# Patient Record
Sex: Female | Born: 1950
Health system: Southern US, Community
[De-identification: ages and names within clinical notes are randomized; demographics above are authoritative.]

## PROBLEM LIST (undated history)

## (undated) DIAGNOSIS — D649 Anemia, unspecified: Secondary | ICD-10-CM

## (undated) DIAGNOSIS — C801 Malignant (primary) neoplasm, unspecified: Secondary | ICD-10-CM

## (undated) DIAGNOSIS — G5601 Carpal tunnel syndrome, right upper limb: Secondary | ICD-10-CM

## (undated) DIAGNOSIS — R011 Cardiac murmur, unspecified: Secondary | ICD-10-CM

## (undated) HISTORY — PX: CARPAL TUNNEL RELEASE: SHX101

## (undated) HISTORY — DX: Anemia, unspecified: D64.9

## (undated) HISTORY — DX: Malignant (primary) neoplasm, unspecified: C80.1

## (undated) HISTORY — PX: NOSE SURGERY: SHX723

## (undated) HISTORY — PX: APPENDECTOMY: SHX54

## (undated) HISTORY — DX: Cardiac murmur, unspecified: R01.1

## (undated) HISTORY — DX: Carpal tunnel syndrome, right upper limb: G56.01

## (undated) HISTORY — PX: BREAST SURGERY: SHX581

---

## 1998-07-04 ENCOUNTER — Other Ambulatory Visit: Admission: RE | Admit: 1998-07-04 | Discharge: 1998-07-04 | Payer: Self-pay | Admitting: Gynecology

## 1999-01-23 ENCOUNTER — Emergency Department (HOSPITAL_COMMUNITY): Admission: EM | Admit: 1999-01-23 | Discharge: 1999-01-23 | Payer: Self-pay | Admitting: Emergency Medicine

## 1999-07-07 ENCOUNTER — Other Ambulatory Visit: Admission: RE | Admit: 1999-07-07 | Discharge: 1999-07-07 | Payer: Self-pay | Admitting: Gynecology

## 2001-12-20 ENCOUNTER — Other Ambulatory Visit: Admission: RE | Admit: 2001-12-20 | Discharge: 2001-12-20 | Payer: Self-pay | Admitting: Gynecology

## 2002-08-28 ENCOUNTER — Emergency Department (HOSPITAL_COMMUNITY): Admission: EM | Admit: 2002-08-28 | Discharge: 2002-08-28 | Payer: Self-pay | Admitting: Emergency Medicine

## 2002-08-28 ENCOUNTER — Encounter: Payer: Self-pay | Admitting: Emergency Medicine

## 2002-12-08 ENCOUNTER — Other Ambulatory Visit: Admission: RE | Admit: 2002-12-08 | Discharge: 2002-12-08 | Payer: Self-pay | Admitting: Family Medicine

## 2003-02-27 ENCOUNTER — Encounter: Admission: RE | Admit: 2003-02-27 | Discharge: 2003-02-27 | Payer: Self-pay | Admitting: Family Medicine

## 2003-02-27 ENCOUNTER — Encounter: Payer: Self-pay | Admitting: Family Medicine

## 2005-04-21 ENCOUNTER — Other Ambulatory Visit: Admission: RE | Admit: 2005-04-21 | Discharge: 2005-04-21 | Payer: Self-pay | Admitting: Gynecology

## 2005-05-20 ENCOUNTER — Encounter: Admission: RE | Admit: 2005-05-20 | Discharge: 2005-05-20 | Payer: Self-pay | Admitting: Surgery

## 2005-05-20 ENCOUNTER — Encounter (INDEPENDENT_AMBULATORY_CARE_PROVIDER_SITE_OTHER): Payer: Self-pay | Admitting: Diagnostic Radiology

## 2005-05-20 ENCOUNTER — Encounter (INDEPENDENT_AMBULATORY_CARE_PROVIDER_SITE_OTHER): Payer: Self-pay | Admitting: Specialist

## 2005-05-20 HISTORY — PX: BREAST BIOPSY: SHX20

## 2005-05-28 ENCOUNTER — Encounter: Admission: RE | Admit: 2005-05-28 | Discharge: 2005-05-28 | Payer: Self-pay | Admitting: Surgery

## 2005-06-03 ENCOUNTER — Encounter: Admission: RE | Admit: 2005-06-03 | Discharge: 2005-06-03 | Payer: Self-pay | Admitting: Surgery

## 2005-06-04 ENCOUNTER — Ambulatory Visit (HOSPITAL_COMMUNITY): Admission: RE | Admit: 2005-06-04 | Discharge: 2005-06-04 | Payer: Self-pay | Admitting: Surgery

## 2005-06-04 ENCOUNTER — Encounter (INDEPENDENT_AMBULATORY_CARE_PROVIDER_SITE_OTHER): Payer: Self-pay | Admitting: Surgery

## 2005-06-04 ENCOUNTER — Ambulatory Visit (HOSPITAL_BASED_OUTPATIENT_CLINIC_OR_DEPARTMENT_OTHER): Admission: RE | Admit: 2005-06-04 | Discharge: 2005-06-05 | Payer: Self-pay | Admitting: Surgery

## 2005-06-04 ENCOUNTER — Encounter (INDEPENDENT_AMBULATORY_CARE_PROVIDER_SITE_OTHER): Payer: Self-pay | Admitting: *Deleted

## 2005-06-04 HISTORY — PX: MASTECTOMY: SHX3

## 2005-06-08 ENCOUNTER — Ambulatory Visit: Payer: Self-pay | Admitting: Oncology

## 2005-06-25 ENCOUNTER — Ambulatory Visit (HOSPITAL_COMMUNITY): Admission: RE | Admit: 2005-06-25 | Discharge: 2005-06-25 | Payer: Self-pay | Admitting: Unknown Physician Specialty

## 2005-10-14 ENCOUNTER — Ambulatory Visit: Payer: Self-pay | Admitting: Oncology

## 2006-02-04 ENCOUNTER — Ambulatory Visit: Payer: Self-pay | Admitting: Oncology

## 2006-02-10 LAB — CBC WITH DIFFERENTIAL/PLATELET
BASO%: 0.4 % (ref 0.0–2.0)
Basophils Absolute: 0 10*3/uL (ref 0.0–0.1)
EOS%: 1.5 % (ref 0.0–7.0)
HGB: 10.6 g/dL — ABNORMAL LOW (ref 11.6–15.9)
MCH: 30 pg (ref 26.0–34.0)
MCHC: 33.5 g/dL (ref 32.0–36.0)
MONO#: 0.5 10*3/uL (ref 0.1–0.9)
RDW: 11.8 % (ref 11.3–14.5)
WBC: 6.1 10*3/uL (ref 3.9–10.0)
lymph#: 2.1 10*3/uL (ref 0.9–3.3)

## 2006-02-10 LAB — MORPHOLOGY: PLT EST: ADEQUATE

## 2006-02-10 LAB — IRON AND TIBC
%SAT: 14 % — ABNORMAL LOW (ref 20–55)
TIBC: 336 ug/dL (ref 250–470)
UIBC: 289 ug/dL

## 2006-02-10 LAB — COMPREHENSIVE METABOLIC PANEL
ALT: 12 U/L (ref 0–40)
AST: 14 U/L (ref 0–37)
Creatinine, Ser: 0.81 mg/dL (ref 0.40–1.20)
Total Bilirubin: 0.3 mg/dL (ref 0.3–1.2)

## 2006-02-10 LAB — FERRITIN: Ferritin: 52 ng/mL (ref 10–291)

## 2006-02-10 LAB — LACTATE DEHYDROGENASE: LDH: 168 U/L (ref 94–250)

## 2006-04-05 ENCOUNTER — Ambulatory Visit: Payer: Self-pay | Admitting: Family Medicine

## 2006-04-07 ENCOUNTER — Encounter: Admission: RE | Admit: 2006-04-07 | Discharge: 2006-04-07 | Payer: Self-pay | Admitting: Family Medicine

## 2006-04-23 ENCOUNTER — Encounter: Admission: RE | Admit: 2006-04-23 | Discharge: 2006-04-23 | Payer: Self-pay | Admitting: Oncology

## 2006-05-04 ENCOUNTER — Ambulatory Visit: Payer: Self-pay | Admitting: Family Medicine

## 2006-05-04 LAB — CONVERTED CEMR LAB
ALT: 17 units/L (ref 0–40)
AST: 23 units/L (ref 0–37)
Albumin: 3.5 g/dL (ref 3.5–5.2)
Alkaline Phosphatase: 34 units/L — ABNORMAL LOW (ref 39–117)
BUN: 10 mg/dL (ref 6–23)
Basophils Absolute: 0 10*3/uL (ref 0.0–0.1)
Basophils Relative: 0.3 % (ref 0.0–1.0)
CO2: 28 meq/L (ref 19–32)
Calcium: 9.4 mg/dL (ref 8.4–10.5)
Chloride: 107 meq/L (ref 96–112)
Chol/HDL Ratio, serum: 2.3
Cholesterol: 161 mg/dL (ref 0–200)
Creatinine, Ser: 0.9 mg/dL (ref 0.4–1.2)
Eosinophil percent: 0.9 % (ref 0.0–5.0)
GFR calc non Af Amer: 70 mL/min
Glomerular Filtration Rate, Af Am: 84 mL/min/{1.73_m2}
Glucose, Bld: 79 mg/dL (ref 70–99)
HCT: 34.3 % — ABNORMAL LOW (ref 36.0–46.0)
HDL: 70.8 mg/dL (ref >39.0)
Hemoglobin: 11.3 g/dL — ABNORMAL LOW (ref 12.0–15.0)
LDL Cholesterol: 79 mg/dL (ref 0–99)
Lymphocytes Relative: 33.6 % (ref 12.0–46.0)
MCHC: 33 g/dL (ref 30.0–36.0)
MCV: 89.8 fL (ref 78.0–100.0)
Monocytes Absolute: 0.3 10*3/uL (ref 0.2–0.7)
Monocytes Relative: 6.4 % (ref 3.0–11.0)
Neutro Abs: 2.8 10*3/uL (ref 1.4–7.7)
Neutrophils Relative %: 58.8 % (ref 43.0–77.0)
Platelets: 205 10*3/uL (ref 150–400)
Potassium: 3.9 meq/L (ref 3.5–5.1)
RBC: 3.82 M/uL — ABNORMAL LOW (ref 3.87–5.11)
RDW: 11.5 % (ref 11.5–14.6)
Sodium: 142 meq/L (ref 135–145)
TSH: 1 microintl units/mL (ref 0.35–5.50)
Total Bilirubin: 0.5 mg/dL (ref 0.3–1.2)
Total Protein: 6.8 g/dL (ref 6.0–8.3)
Triglyceride fasting, serum: 55 mg/dL (ref 0–149)
VLDL: 11 mg/dL (ref 0–40)
WBC: 4.6 10*3/uL (ref 4.5–10.5)

## 2006-05-11 ENCOUNTER — Ambulatory Visit: Payer: Self-pay | Admitting: Family Medicine

## 2006-05-11 ENCOUNTER — Other Ambulatory Visit: Admission: RE | Admit: 2006-05-11 | Discharge: 2006-05-11 | Payer: Self-pay | Admitting: Family Medicine

## 2006-05-11 ENCOUNTER — Encounter: Payer: Self-pay | Admitting: Family Medicine

## 2006-05-11 ENCOUNTER — Encounter: Admission: RE | Admit: 2006-05-11 | Discharge: 2006-05-11 | Payer: Self-pay | Admitting: Oncology

## 2006-06-07 ENCOUNTER — Ambulatory Visit: Payer: Self-pay | Admitting: Oncology

## 2006-06-09 LAB — IRON AND TIBC
%SAT: 16 % — ABNORMAL LOW (ref 20–55)
TIBC: 328 ug/dL (ref 250–470)

## 2006-06-09 LAB — COMPREHENSIVE METABOLIC PANEL
ALT: 12 U/L (ref 0–35)
AST: 15 U/L (ref 0–37)
BUN: 11 mg/dL (ref 6–23)
CO2: 26 mEq/L (ref 19–32)
Creatinine, Ser: 0.76 mg/dL (ref 0.40–1.20)
Glucose, Bld: 101 mg/dL — ABNORMAL HIGH (ref 70–99)
Potassium: 3.7 mEq/L (ref 3.5–5.3)
Total Bilirubin: 0.3 mg/dL (ref 0.3–1.2)

## 2006-06-09 LAB — CBC WITH DIFFERENTIAL/PLATELET
Basophils Absolute: 0 10*3/uL (ref 0.0–0.1)
Eosinophils Absolute: 0 10*3/uL (ref 0.0–0.5)
HGB: 10.6 g/dL — ABNORMAL LOW (ref 11.6–15.9)
MCV: 89.2 fL (ref 81.0–101.0)
MONO#: 0.6 10*3/uL (ref 0.1–0.9)
MONO%: 8.1 % (ref 0.0–13.0)
NEUT#: 4.2 10*3/uL (ref 1.5–6.5)
Platelets: 225 10*3/uL (ref 145–400)
RDW: 12.3 % (ref 11.3–14.5)
WBC: 7.1 10*3/uL (ref 3.9–10.0)

## 2006-06-09 LAB — FERRITIN: Ferritin: 55 ng/mL (ref 10–291)

## 2006-07-15 ENCOUNTER — Ambulatory Visit: Payer: Self-pay | Admitting: Oncology

## 2006-10-29 ENCOUNTER — Ambulatory Visit: Payer: Self-pay | Admitting: Internal Medicine

## 2007-01-26 ENCOUNTER — Ambulatory Visit: Payer: Self-pay | Admitting: Oncology

## 2007-01-31 LAB — CBC WITH DIFFERENTIAL/PLATELET
EOS%: 0.5 % (ref 0.0–7.0)
MCH: 29.5 pg (ref 26.0–34.0)
MCV: 88.4 fL (ref 81.0–101.0)
MONO%: 6.5 % (ref 0.0–13.0)
NEUT#: 4 10*3/uL (ref 1.5–6.5)
RBC: 3.64 10*6/uL — ABNORMAL LOW (ref 3.70–5.32)
RDW: 12.6 % (ref 11.3–14.5)
lymph#: 2.6 10*3/uL (ref 0.9–3.3)

## 2007-01-31 LAB — COMPREHENSIVE METABOLIC PANEL
ALT: 13 U/L (ref 0–35)
AST: 17 U/L (ref 0–37)
Albumin: 4.3 g/dL (ref 3.5–5.2)
Alkaline Phosphatase: 36 U/L — ABNORMAL LOW (ref 39–117)
Potassium: 4 mEq/L (ref 3.5–5.3)
Sodium: 140 mEq/L (ref 135–145)
Total Protein: 7.2 g/dL (ref 6.0–8.3)

## 2007-02-02 LAB — HEMOGLOBINOPATHY EVALUATION
Hgb F Quant: 0 % (ref 0.0–2.0)
Hgb S Quant: 0 % (ref 0.0–0.0)

## 2007-04-26 IMAGING — CR DG CHEST 2V
2 series · 2 of 2 positions shown · non-contrast
Comparison: none

CLINICAL DATA: Pre-op for breast surgery for carcinoma. 
 CHEST X-RAY: 
 The heart size and mediastinal contours are unremarkable.  The lungs are clear.  The visualized skeleton is unremarkable.

[w chest pa]
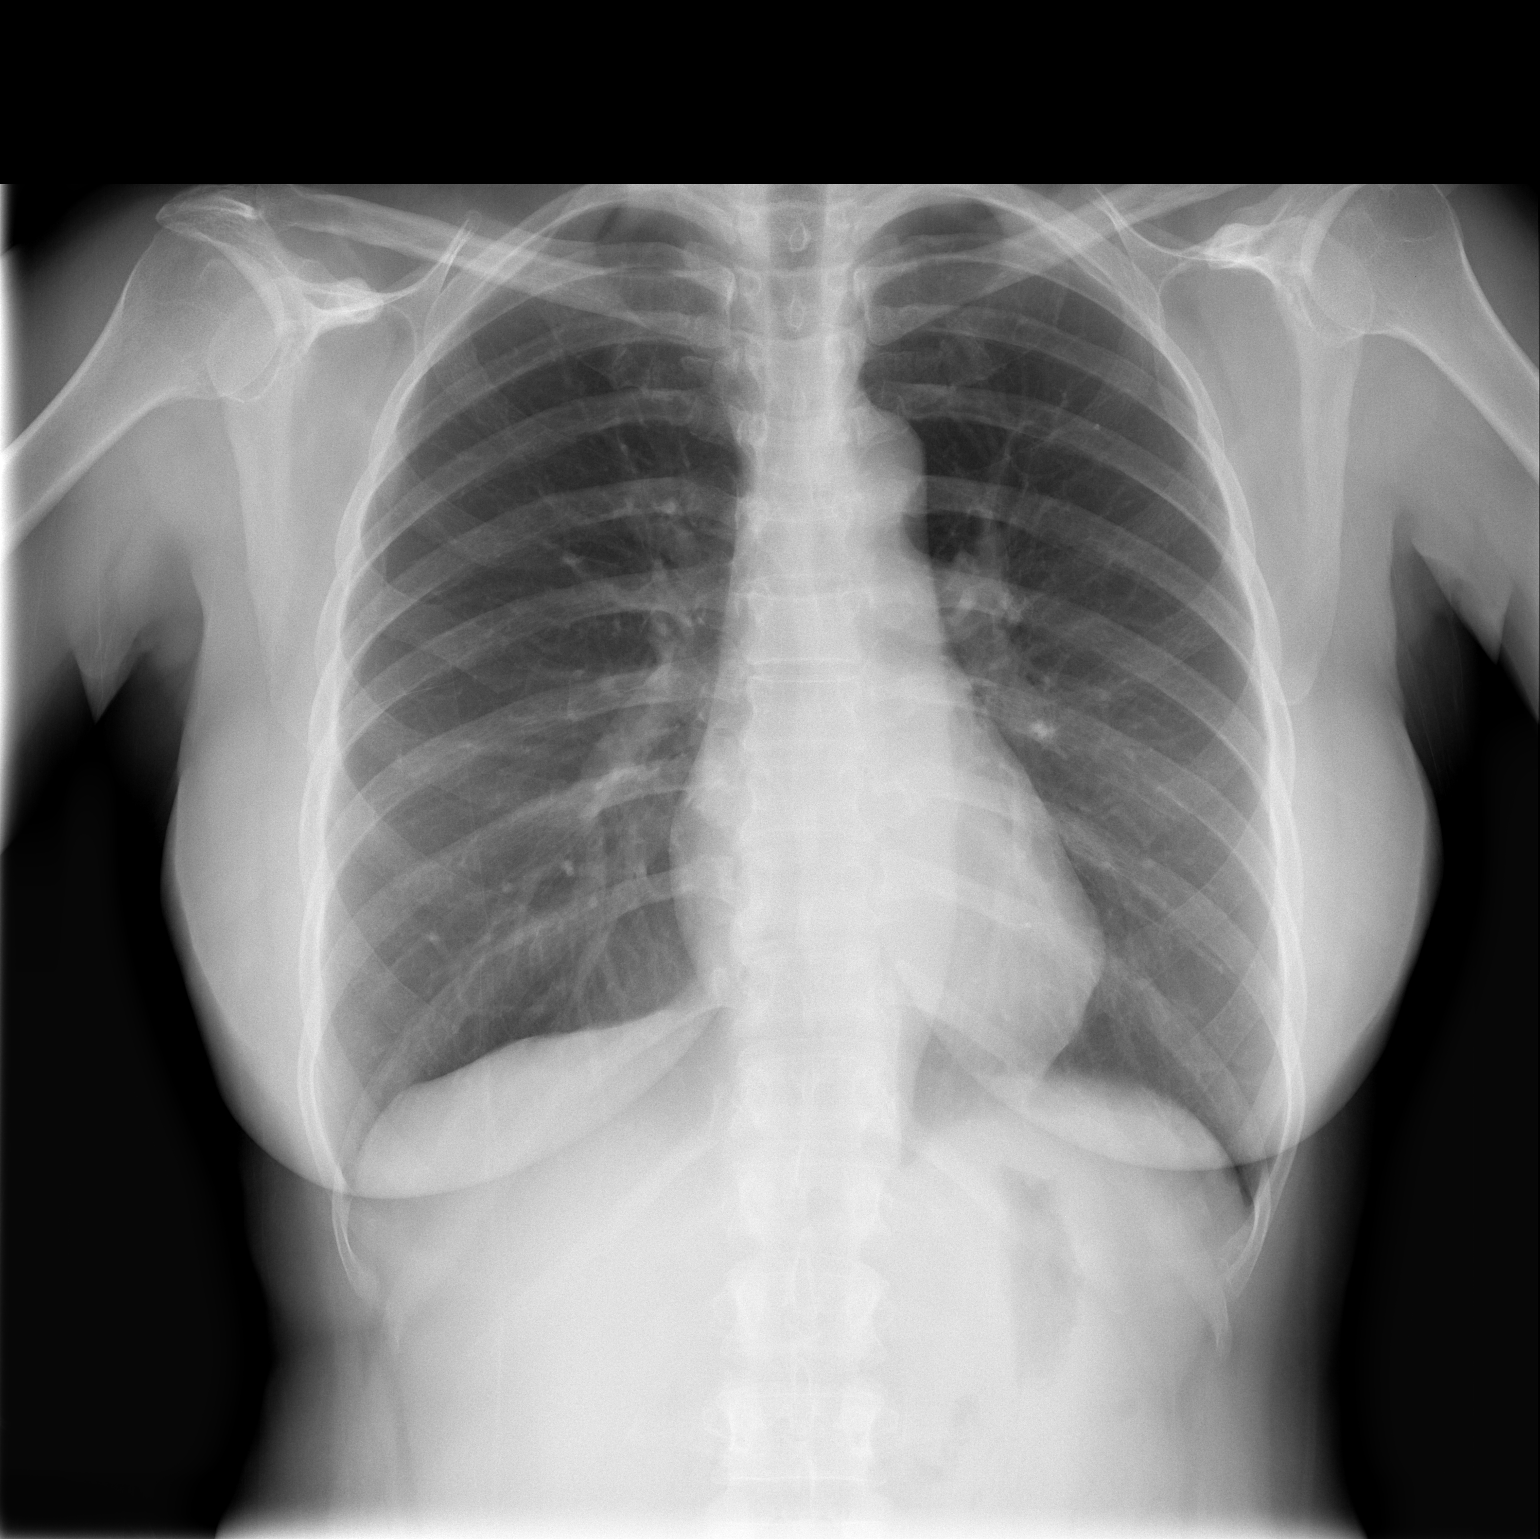

[w chest lat]
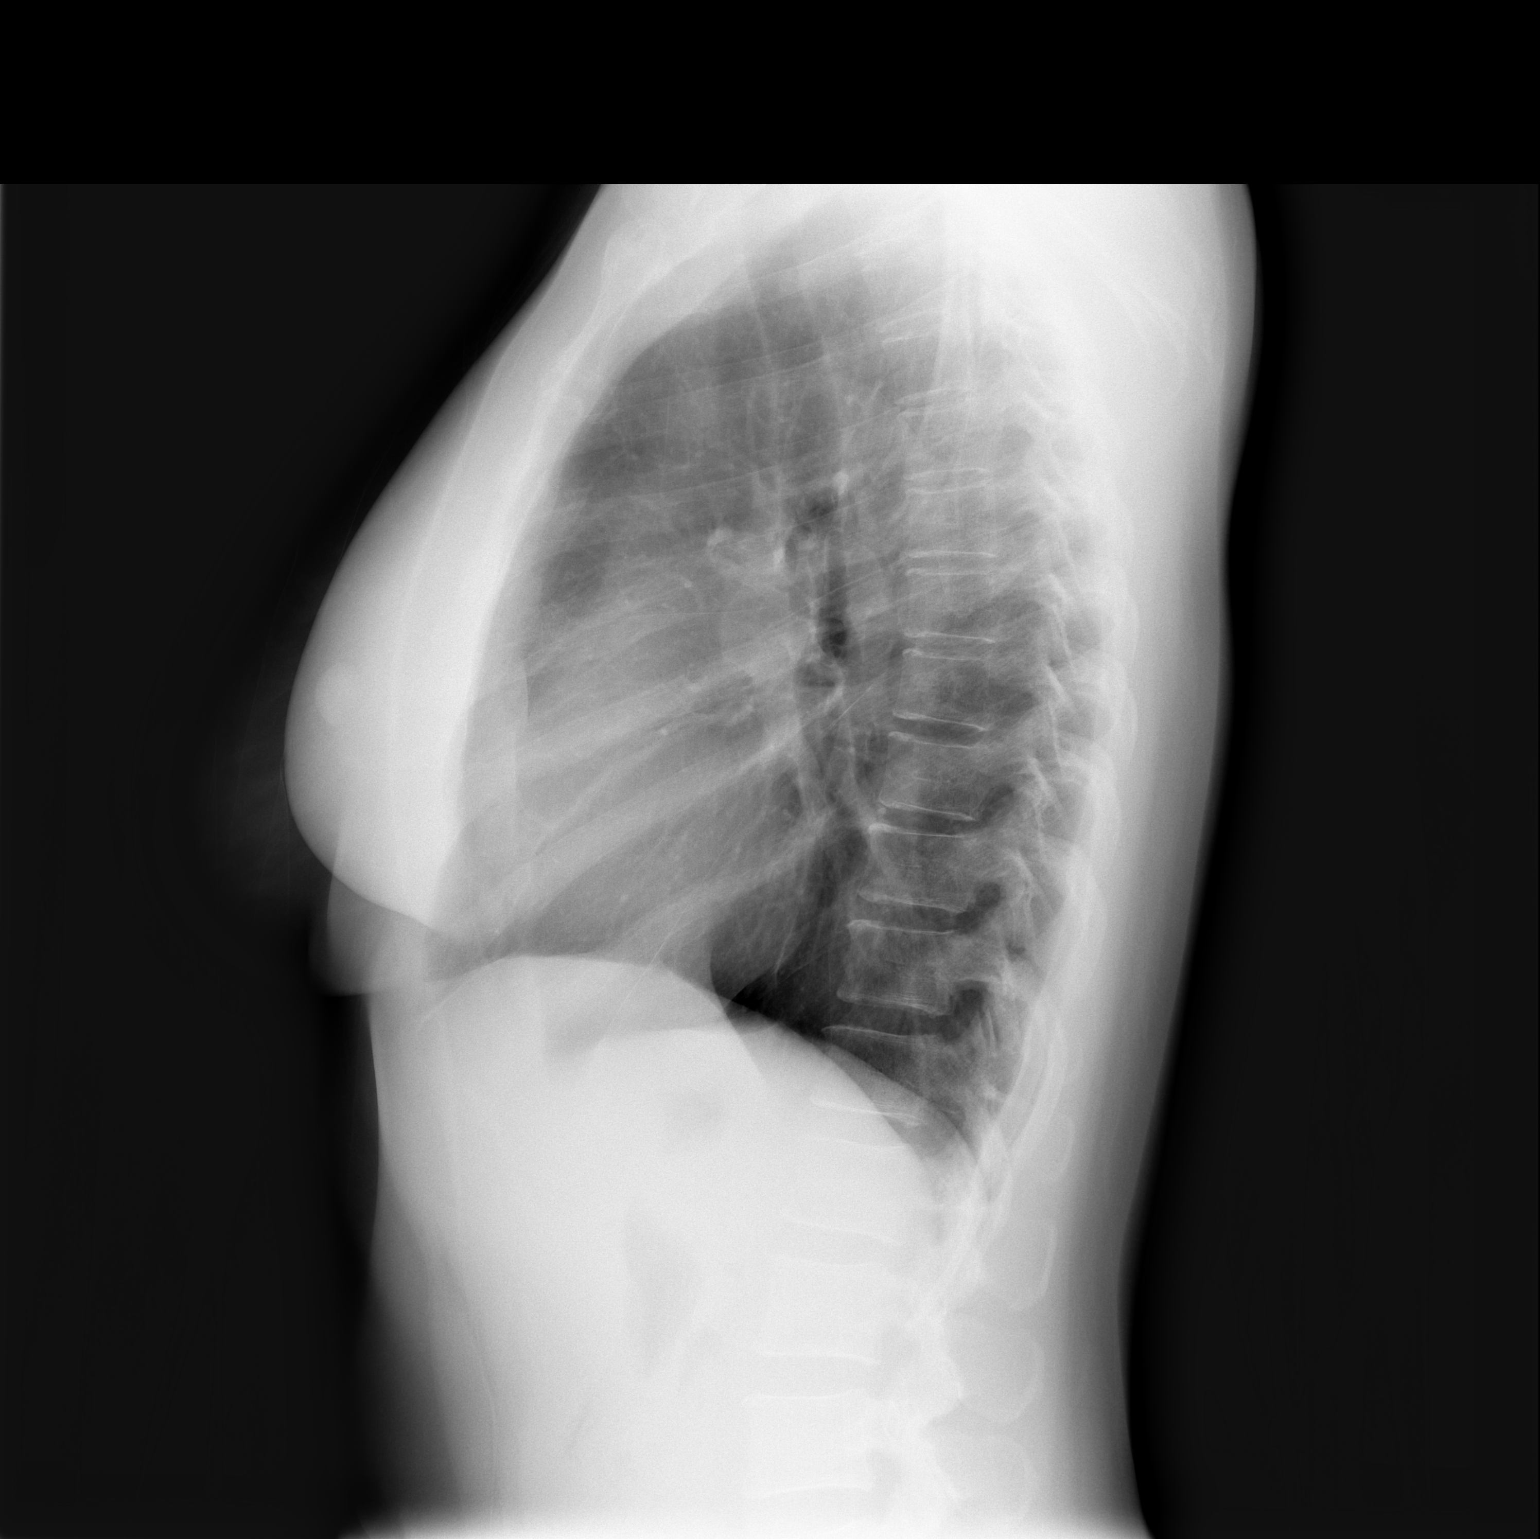

[2 of 2 positions shown; findings below may reference images not displayed]

IMPRESSION: No active lung disease.

## 2007-05-04 ENCOUNTER — Encounter: Admission: RE | Admit: 2007-05-04 | Discharge: 2007-05-04 | Payer: Self-pay | Admitting: Oncology

## 2007-09-01 ENCOUNTER — Ambulatory Visit: Payer: Self-pay | Admitting: Oncology

## 2007-09-05 LAB — CBC & DIFF AND RETIC
Basophils Absolute: 0 10*3/uL (ref 0.0–0.1)
Eosinophils Absolute: 0 10*3/uL (ref 0.0–0.5)
HCT: 32.6 % — ABNORMAL LOW (ref 34.8–46.6)
HGB: 10.9 g/dL — ABNORMAL LOW (ref 11.6–15.9)
IRF: 0.21 (ref 0.130–0.330)
MCH: 29.5 pg (ref 26.0–34.0)
MCV: 87.6 fL (ref 81.0–101.0)
NEUT#: 3.5 10*3/uL (ref 1.5–6.5)
NEUT%: 58.3 % (ref 39.6–76.8)
RDW: 12.6 % (ref 11.3–14.5)
RETIC #: 11.5 10*3/uL — ABNORMAL LOW (ref 19.7–115.1)
lymph#: 1.9 10*3/uL (ref 0.9–3.3)

## 2007-09-05 LAB — MORPHOLOGY: PLT EST: ADEQUATE

## 2007-09-06 LAB — COMPREHENSIVE METABOLIC PANEL
Albumin: 4.2 g/dL (ref 3.5–5.2)
BUN: 12 mg/dL (ref 6–23)
Calcium: 9.3 mg/dL (ref 8.4–10.5)
Chloride: 104 mEq/L (ref 96–112)
Glucose, Bld: 62 mg/dL — ABNORMAL LOW (ref 70–99)
Potassium: 4 mEq/L (ref 3.5–5.3)

## 2007-09-06 LAB — VITAMIN B12: Vitamin B-12: 457 pg/mL (ref 211–911)

## 2007-09-06 LAB — IRON AND TIBC
Iron: 123 ug/dL (ref 42–145)
UIBC: 226 ug/dL

## 2007-09-16 ENCOUNTER — Ambulatory Visit: Payer: Self-pay | Admitting: Family Medicine

## 2007-09-16 LAB — CONVERTED CEMR LAB
ALT: 16 units/L (ref 0–35)
AST: 21 units/L (ref 0–37)
Albumin: 3.6 g/dL (ref 3.5–5.2)
Alkaline Phosphatase: 35 units/L — ABNORMAL LOW (ref 39–117)
BUN: 8 mg/dL (ref 6–23)
Basophils Absolute: 0 10*3/uL (ref 0.0–0.1)
Basophils Relative: 0.3 % (ref 0.0–1.0)
Bilirubin Urine: NEGATIVE
Bilirubin, Direct: 0.2 mg/dL (ref 0.0–0.3)
Blood in Urine, dipstick: NEGATIVE
CO2: 30 meq/L (ref 19–32)
Calcium: 9.4 mg/dL (ref 8.4–10.5)
Chloride: 108 meq/L (ref 96–112)
Cholesterol: 147 mg/dL (ref 0–200)
Creatinine, Ser: 0.7 mg/dL (ref 0.4–1.2)
Eosinophils Absolute: 0 10*3/uL (ref 0.0–0.6)
Eosinophils Relative: 0.7 % (ref 0.0–5.0)
GFR calc Af Amer: 112 mL/min
GFR calc non Af Amer: 92 mL/min
Glucose, Bld: 78 mg/dL (ref 70–99)
Glucose, Urine, Semiquant: NEGATIVE
HCT: 33.8 % — ABNORMAL LOW (ref 36.0–46.0)
HDL: 67.6 mg/dL (ref >39.0)
Hemoglobin: 11 g/dL — ABNORMAL LOW (ref 12.0–15.0)
Ketones, urine, test strip: NEGATIVE
LDL Cholesterol: 71 mg/dL (ref 0–99)
Lymphocytes Relative: 28.6 % (ref 12.0–46.0)
MCHC: 32.6 g/dL (ref 30.0–36.0)
MCV: 89.8 fL (ref 78.0–100.0)
Monocytes Absolute: 0.3 10*3/uL (ref 0.2–0.7)
Monocytes Relative: 7.1 % (ref 3.0–11.0)
Neutro Abs: 3.1 10*3/uL (ref 1.4–7.7)
Neutrophils Relative %: 63.3 % (ref 43.0–77.0)
Nitrite: NEGATIVE
Platelets: 201 10*3/uL (ref 150–400)
Potassium: 4.1 meq/L (ref 3.5–5.1)
Protein, U semiquant: NEGATIVE
RBC: 3.77 M/uL — ABNORMAL LOW (ref 3.87–5.11)
RDW: 11.4 % — ABNORMAL LOW (ref 11.5–14.6)
Sodium: 142 meq/L (ref 135–145)
Specific Gravity, Urine: 1.025
TSH: 1.73 microintl units/mL (ref 0.35–5.50)
Total Bilirubin: 0.6 mg/dL (ref 0.3–1.2)
Total CHOL/HDL Ratio: 2.2
Total Protein: 6.4 g/dL (ref 6.0–8.3)
Triglycerides: 44 mg/dL (ref 0–149)
Urobilinogen, UA: 0.2
VLDL: 9 mg/dL (ref 0–40)
WBC Urine, dipstick: NEGATIVE
WBC: 4.8 10*3/uL (ref 4.5–10.5)
pH: 5.5

## 2007-09-23 ENCOUNTER — Ambulatory Visit: Payer: Self-pay | Admitting: Family Medicine

## 2007-09-23 ENCOUNTER — Encounter: Payer: Self-pay | Admitting: Family Medicine

## 2007-09-23 ENCOUNTER — Other Ambulatory Visit: Admission: RE | Admit: 2007-09-23 | Discharge: 2007-09-23 | Payer: Self-pay | Admitting: Family Medicine

## 2007-09-24 DIAGNOSIS — Z9010 Acquired absence of unspecified breast and nipple: Secondary | ICD-10-CM

## 2007-10-21 ENCOUNTER — Ambulatory Visit: Payer: Self-pay | Admitting: Gastroenterology

## 2007-10-31 ENCOUNTER — Encounter: Payer: Self-pay | Admitting: Family Medicine

## 2007-10-31 ENCOUNTER — Encounter: Payer: Self-pay | Admitting: Gastroenterology

## 2007-10-31 ENCOUNTER — Ambulatory Visit: Payer: Self-pay | Admitting: Gastroenterology

## 2008-02-28 ENCOUNTER — Ambulatory Visit: Payer: Self-pay | Admitting: Oncology

## 2008-03-02 LAB — CBC & DIFF AND RETIC
BASO%: 0.6 % (ref 0.0–2.0)
EOS%: 0.7 % (ref 0.0–7.0)
LYMPH%: 29.2 % (ref 14.0–48.0)
MCH: 30 pg (ref 26.0–34.0)
MCHC: 33.4 g/dL (ref 32.0–36.0)
MCV: 89.8 fL (ref 81.0–101.0)
MONO%: 5.8 % (ref 0.0–13.0)
NEUT#: 3.6 10*3/uL (ref 1.5–6.5)
Platelets: 180 10*3/uL (ref 145–400)
RBC: 3.68 10*6/uL — ABNORMAL LOW (ref 3.70–5.32)
RDW: 12.4 % (ref 11.3–14.5)
RETIC #: 26.1 10*3/uL (ref 19.7–115.1)
Retic %: 0.7 % (ref 0.4–2.3)

## 2008-03-05 LAB — COMPREHENSIVE METABOLIC PANEL
AST: 18 U/L (ref 0–37)
Albumin: 4.3 g/dL (ref 3.5–5.2)
Alkaline Phosphatase: 43 U/L (ref 39–117)
BUN: 13 mg/dL (ref 6–23)
Creatinine, Ser: 0.66 mg/dL (ref 0.40–1.20)
Glucose, Bld: 82 mg/dL (ref 70–99)

## 2008-03-05 LAB — IRON AND TIBC
%SAT: 15 % — ABNORMAL LOW (ref 20–55)
Iron: 53 ug/dL (ref 42–145)
UIBC: 292 ug/dL

## 2008-03-05 LAB — FERRITIN: Ferritin: 67 ng/mL (ref 10–291)

## 2008-03-05 LAB — CANCER ANTIGEN 27.29: CA 27.29: 19 U/mL (ref 0–39)

## 2008-03-11 LAB — ESTRADIOL, ULTRA SENS

## 2008-04-05 ENCOUNTER — Encounter: Admission: RE | Admit: 2008-04-05 | Discharge: 2008-04-05 | Payer: Self-pay | Admitting: Oncology

## 2008-05-04 ENCOUNTER — Encounter: Admission: RE | Admit: 2008-05-04 | Discharge: 2008-05-04 | Payer: Self-pay | Admitting: Family Medicine

## 2008-09-18 ENCOUNTER — Ambulatory Visit: Payer: Self-pay | Admitting: Family Medicine

## 2008-09-18 LAB — CONVERTED CEMR LAB
ALT: 16 units/L (ref 0–35)
AST: 20 units/L (ref 0–37)
Albumin: 3.7 g/dL (ref 3.5–5.2)
Alkaline Phosphatase: 37 units/L — ABNORMAL LOW (ref 39–117)
BUN: 10 mg/dL (ref 6–23)
Basophils Absolute: 0 10*3/uL (ref 0.0–0.1)
Basophils Relative: 0.4 % (ref 0.0–3.0)
Bilirubin Urine: NEGATIVE
Bilirubin, Direct: 0.1 mg/dL (ref 0.0–0.3)
Blood in Urine, dipstick: NEGATIVE
CO2: 29 meq/L (ref 19–32)
Calcium: 9.5 mg/dL (ref 8.4–10.5)
Chloride: 104 meq/L (ref 96–112)
Cholesterol: 177 mg/dL (ref 0–200)
Creatinine, Ser: 0.6 mg/dL (ref 0.4–1.2)
Eosinophils Absolute: 0.1 10*3/uL (ref 0.0–0.7)
Eosinophils Relative: 1.2 % (ref 0.0–5.0)
GFR calc Af Amer: 133 mL/min
GFR calc non Af Amer: 110 mL/min
Glucose, Bld: 75 mg/dL (ref 70–99)
Glucose, Urine, Semiquant: NEGATIVE
HCT: 32.6 % — ABNORMAL LOW (ref 36.0–46.0)
HDL: 85.4 mg/dL (ref >39.0)
Hemoglobin: 10.8 g/dL — ABNORMAL LOW (ref 12.0–15.0)
Ketones, urine, test strip: NEGATIVE
LDL Cholesterol: 81 mg/dL (ref 0–99)
Lymphocytes Relative: 30.3 % (ref 12.0–46.0)
MCHC: 33.1 g/dL (ref 30.0–36.0)
MCV: 89.6 fL (ref 78.0–100.0)
Monocytes Absolute: 0.3 10*3/uL (ref 0.1–1.0)
Monocytes Relative: 6.4 % (ref 3.0–12.0)
Neutro Abs: 2.9 10*3/uL (ref 1.4–7.7)
Neutrophils Relative %: 61.7 % (ref 43.0–77.0)
Nitrite: NEGATIVE
Platelets: 160 10*3/uL (ref 150–400)
Potassium: 3.7 meq/L (ref 3.5–5.1)
Protein, U semiquant: NEGATIVE
RBC: 3.64 M/uL — ABNORMAL LOW (ref 3.87–5.11)
RDW: 11.9 % (ref 11.5–14.6)
Sodium: 142 meq/L (ref 135–145)
Specific Gravity, Urine: 1.02
TSH: 1.25 microintl units/mL (ref 0.35–5.50)
Total Bilirubin: 0.9 mg/dL (ref 0.3–1.2)
Total CHOL/HDL Ratio: 2.1
Total Protein: 6.7 g/dL (ref 6.0–8.3)
Triglycerides: 53 mg/dL (ref 0–149)
Urobilinogen, UA: 0.2
VLDL: 11 mg/dL (ref 0–40)
WBC Urine, dipstick: NEGATIVE
WBC: 4.8 10*3/uL (ref 4.5–10.5)
pH: 5

## 2008-09-24 ENCOUNTER — Other Ambulatory Visit: Admission: RE | Admit: 2008-09-24 | Discharge: 2008-09-24 | Payer: Self-pay | Admitting: Family Medicine

## 2008-09-24 ENCOUNTER — Encounter: Payer: Self-pay | Admitting: Family Medicine

## 2008-09-24 ENCOUNTER — Ambulatory Visit: Payer: Self-pay | Admitting: Family Medicine

## 2008-09-24 DIAGNOSIS — D649 Anemia, unspecified: Secondary | ICD-10-CM | POA: Insufficient documentation

## 2008-09-26 ENCOUNTER — Telehealth: Payer: Self-pay | Admitting: *Deleted

## 2008-10-05 ENCOUNTER — Ambulatory Visit: Payer: Self-pay | Admitting: Oncology

## 2008-10-09 LAB — LACTATE DEHYDROGENASE: LDH: 146 U/L (ref 94–250)

## 2008-10-09 LAB — COMPREHENSIVE METABOLIC PANEL
ALT: 19 U/L (ref 0–35)
AST: 22 U/L (ref 0–37)
Albumin: 3.7 g/dL (ref 3.5–5.2)
Calcium: 9 mg/dL (ref 8.4–10.5)
Chloride: 105 mEq/L (ref 96–112)
Potassium: 3.8 mEq/L (ref 3.5–5.3)
Total Protein: 6.7 g/dL (ref 6.0–8.3)

## 2008-10-09 LAB — CBC WITH DIFFERENTIAL/PLATELET
BASO%: 0.3 % (ref 0.0–2.0)
EOS%: 1.1 % (ref 0.0–7.0)
HGB: 10.6 g/dL — ABNORMAL LOW (ref 11.6–15.9)
MCH: 29.5 pg (ref 25.1–34.0)
MCHC: 33 g/dL (ref 31.5–36.0)
RDW: 12.7 % (ref 11.2–14.5)
WBC: 8.5 10*3/uL (ref 3.9–10.3)
lymph#: 2.2 10*3/uL (ref 0.9–3.3)

## 2008-10-10 LAB — IRON AND TIBC
%SAT: 10 % — ABNORMAL LOW (ref 20–55)
Iron: 33 ug/dL — ABNORMAL LOW (ref 42–145)

## 2008-10-10 LAB — VITAMIN D 25 HYDROXY (VIT D DEFICIENCY, FRACTURES): Vit D, 25-Hydroxy: 21 ng/mL — ABNORMAL LOW (ref 30–89)

## 2008-10-19 ENCOUNTER — Ambulatory Visit: Payer: Self-pay | Admitting: Family Medicine

## 2008-10-19 ENCOUNTER — Encounter: Payer: Self-pay | Admitting: Family Medicine

## 2008-10-23 DIAGNOSIS — B079 Viral wart, unspecified: Secondary | ICD-10-CM | POA: Insufficient documentation

## 2008-12-19 ENCOUNTER — Ambulatory Visit: Payer: Self-pay | Admitting: Oncology

## 2008-12-21 LAB — MORPHOLOGY: PLT EST: ADEQUATE

## 2008-12-21 LAB — CHCC SMEAR

## 2008-12-21 LAB — CBC & DIFF AND RETIC
Basophils Absolute: 0 10*3/uL (ref 0.0–0.1)
EOS%: 1.6 % (ref 0.0–7.0)
Eosinophils Absolute: 0.1 10*3/uL (ref 0.0–0.5)
HGB: 10.9 g/dL — ABNORMAL LOW (ref 11.6–15.9)
NEUT#: 4.8 10*3/uL (ref 1.5–6.5)
RBC: 3.6 10*6/uL — ABNORMAL LOW (ref 3.70–5.45)
RDW: 12.3 % (ref 11.2–14.5)
RETIC #: 22.3 10*3/uL (ref 19.7–115.1)
Retic %: 0.6 % (ref 0.4–2.3)
lymph#: 1.7 10*3/uL (ref 0.9–3.3)

## 2008-12-21 LAB — IRON AND TIBC
%SAT: 27 % (ref 20–55)
TIBC: 296 ug/dL (ref 250–470)

## 2008-12-21 LAB — FERRITIN: Ferritin: 176 ng/mL (ref 10–291)

## 2009-04-24 ENCOUNTER — Ambulatory Visit: Payer: Self-pay | Admitting: Oncology

## 2009-04-26 LAB — CBC & DIFF AND RETIC
Basophils Absolute: 0 10*3/uL (ref 0.0–0.1)
HCT: 32.4 % — ABNORMAL LOW (ref 34.8–46.6)
HGB: 10.4 g/dL — ABNORMAL LOW (ref 11.6–15.9)
Immature Retic Fract: 3.2 % (ref 0.00–10.70)
MONO#: 0.5 10*3/uL (ref 0.1–0.9)
NEUT%: 58.5 % (ref 38.4–76.8)
WBC: 6.2 10*3/uL (ref 3.9–10.3)
lymph#: 2 10*3/uL (ref 0.9–3.3)

## 2009-04-26 LAB — CHCC SMEAR

## 2009-04-27 LAB — COMPREHENSIVE METABOLIC PANEL
ALT: 15 U/L (ref 0–35)
CO2: 25 mEq/L (ref 19–32)
Chloride: 109 mEq/L (ref 96–112)
Sodium: 142 mEq/L (ref 135–145)
Total Bilirubin: 0.3 mg/dL (ref 0.3–1.2)
Total Protein: 6.6 g/dL (ref 6.0–8.3)

## 2009-04-27 LAB — LACTATE DEHYDROGENASE: LDH: 150 U/L (ref 94–250)

## 2009-05-06 ENCOUNTER — Encounter: Admission: RE | Admit: 2009-05-06 | Discharge: 2009-05-06 | Payer: Self-pay | Admitting: Family Medicine

## 2009-10-15 ENCOUNTER — Ambulatory Visit: Payer: Self-pay | Admitting: Family Medicine

## 2009-10-15 LAB — CONVERTED CEMR LAB
ALT: 18 units/L (ref 0–35)
AST: 20 units/L (ref 0–37)
Albumin: 3.6 g/dL (ref 3.5–5.2)
Alkaline Phosphatase: 37 units/L — ABNORMAL LOW (ref 39–117)
BUN: 6 mg/dL (ref 6–23)
Basophils Absolute: 0 10*3/uL (ref 0.0–0.1)
Basophils Relative: 0.7 % (ref 0.0–3.0)
Bilirubin Urine: NEGATIVE
Bilirubin, Direct: 0.1 mg/dL (ref 0.0–0.3)
Blood in Urine, dipstick: NEGATIVE
CO2: 29 meq/L (ref 19–32)
Calcium: 9.1 mg/dL (ref 8.4–10.5)
Chloride: 108 meq/L (ref 96–112)
Cholesterol: 163 mg/dL (ref 0–200)
Creatinine, Ser: 0.7 mg/dL (ref 0.4–1.2)
Eosinophils Absolute: 0.1 10*3/uL (ref 0.0–0.7)
Eosinophils Relative: 1.1 % (ref 0.0–5.0)
GFR calc non Af Amer: 110.84 mL/min (ref >60)
Glucose, Bld: 77 mg/dL (ref 70–99)
Glucose, Urine, Semiquant: NEGATIVE
HCT: 33.1 % — ABNORMAL LOW (ref 36.0–46.0)
HDL: 80.9 mg/dL (ref >39.00)
Hemoglobin: 10.6 g/dL — ABNORMAL LOW (ref 12.0–15.0)
Ketones, urine, test strip: NEGATIVE
LDL Cholesterol: 69 mg/dL (ref 0–99)
Lymphocytes Relative: 28.3 % (ref 12.0–46.0)
Lymphs Abs: 1.5 10*3/uL (ref 0.7–4.0)
MCHC: 32 g/dL (ref 30.0–36.0)
MCV: 91.9 fL (ref 78.0–100.0)
Monocytes Absolute: 0.3 10*3/uL (ref 0.1–1.0)
Monocytes Relative: 5.5 % (ref 3.0–12.0)
Neutro Abs: 3.5 10*3/uL (ref 1.4–7.7)
Neutrophils Relative %: 64.4 % (ref 43.0–77.0)
Nitrite: NEGATIVE
Platelets: 170 10*3/uL (ref 150.0–400.0)
Potassium: 3.5 meq/L (ref 3.5–5.1)
RBC: 3.6 M/uL — ABNORMAL LOW (ref 3.87–5.11)
RDW: 11.7 % (ref 11.5–14.6)
Sodium: 143 meq/L (ref 135–145)
Specific Gravity, Urine: 1.025
TSH: 1.2 microintl units/mL (ref 0.35–5.50)
Total Bilirubin: 0.6 mg/dL (ref 0.3–1.2)
Total CHOL/HDL Ratio: 2
Total Protein: 6.9 g/dL (ref 6.0–8.3)
Triglycerides: 64 mg/dL (ref 0.0–149.0)
Urobilinogen, UA: 0.2
VLDL: 12.8 mg/dL (ref 0.0–40.0)
WBC Urine, dipstick: NEGATIVE
WBC: 5.4 10*3/uL (ref 4.5–10.5)
pH: 5.5

## 2009-10-21 ENCOUNTER — Other Ambulatory Visit: Admission: RE | Admit: 2009-10-21 | Discharge: 2009-10-21 | Payer: Self-pay | Admitting: Family Medicine

## 2009-10-21 ENCOUNTER — Ambulatory Visit: Payer: Self-pay | Admitting: Family Medicine

## 2009-10-21 ENCOUNTER — Ambulatory Visit: Payer: Self-pay | Admitting: Oncology

## 2009-10-21 DIAGNOSIS — R0789 Other chest pain: Secondary | ICD-10-CM | POA: Insufficient documentation

## 2009-10-22 ENCOUNTER — Ambulatory Visit: Payer: Self-pay | Admitting: Family Medicine

## 2009-10-25 LAB — COMPREHENSIVE METABOLIC PANEL
ALT: 16 U/L (ref 0–35)
Albumin: 3.7 g/dL (ref 3.5–5.2)
Alkaline Phosphatase: 37 U/L — ABNORMAL LOW (ref 39–117)
CO2: 30 mEq/L (ref 19–32)
Glucose, Bld: 75 mg/dL (ref 70–99)
Potassium: 3.6 mEq/L (ref 3.5–5.3)
Sodium: 139 mEq/L (ref 135–145)
Total Protein: 6.7 g/dL (ref 6.0–8.3)

## 2009-10-25 LAB — CBC & DIFF AND RETIC
Eosinophils Absolute: 0.1 10*3/uL (ref 0.0–0.5)
HCT: 32.9 % — ABNORMAL LOW (ref 34.8–46.6)
HGB: 10.6 g/dL — ABNORMAL LOW (ref 11.6–15.9)
Immature Retic Fract: 3.6 % (ref 0.00–10.70)
LYMPH%: 35.4 % (ref 14.0–49.7)
MONO#: 0.4 10*3/uL (ref 0.1–0.9)
NEUT#: 3.2 10*3/uL (ref 1.5–6.5)
NEUT%: 56.1 % (ref 38.4–76.8)
Platelets: 185 10*3/uL (ref 145–400)
WBC: 5.7 10*3/uL (ref 3.9–10.3)

## 2009-10-25 LAB — CHCC SMEAR

## 2009-10-25 LAB — MORPHOLOGY
PLT EST: ADEQUATE
RBC Comments: NORMAL

## 2009-10-25 LAB — LACTATE DEHYDROGENASE: LDH: 148 U/L (ref 94–250)

## 2009-10-29 LAB — HEMOGLOBINOPATHY EVALUATION: Hgb S Quant: 0 % (ref 0.0–0.0)

## 2009-10-29 LAB — IRON AND TIBC: UIBC: 273 ug/dL

## 2009-10-29 LAB — VITAMIN B12: Vitamin B-12: 507 pg/mL (ref 211–911)

## 2009-10-29 LAB — ERYTHROPOIETIN: Erythropoietin: 7.8 m[IU]/mL (ref 2.6–34.0)

## 2009-10-29 LAB — FOLATE RBC: RBC Folate: 402 ng/mL (ref 180–600)

## 2009-10-29 LAB — CANCER ANTIGEN 27.29: CA 27.29: 20 U/mL (ref 0–39)

## 2010-02-05 ENCOUNTER — Emergency Department (HOSPITAL_BASED_OUTPATIENT_CLINIC_OR_DEPARTMENT_OTHER): Admission: EM | Admit: 2010-02-05 | Discharge: 2010-02-05 | Payer: Self-pay | Admitting: Emergency Medicine

## 2010-02-05 ENCOUNTER — Ambulatory Visit: Payer: Self-pay | Admitting: Radiology

## 2010-02-10 ENCOUNTER — Telehealth (INDEPENDENT_AMBULATORY_CARE_PROVIDER_SITE_OTHER): Payer: Self-pay | Admitting: *Deleted

## 2010-03-07 ENCOUNTER — Telehealth: Payer: Self-pay | Admitting: Family Medicine

## 2010-03-07 ENCOUNTER — Encounter: Payer: Self-pay | Admitting: Family Medicine

## 2010-05-07 ENCOUNTER — Encounter: Admission: RE | Admit: 2010-05-07 | Discharge: 2010-05-07 | Payer: Self-pay | Admitting: Oncology

## 2010-05-19 ENCOUNTER — Encounter: Admission: RE | Admit: 2010-05-19 | Discharge: 2010-05-19 | Payer: Self-pay | Admitting: Oncology

## 2010-05-27 ENCOUNTER — Ambulatory Visit: Payer: Self-pay | Admitting: Oncology

## 2010-05-29 LAB — COMPREHENSIVE METABOLIC PANEL
ALT: 14 U/L (ref 0–35)
Albumin: 3.5 g/dL (ref 3.5–5.2)
CO2: 27 mEq/L (ref 19–32)
Calcium: 8.9 mg/dL (ref 8.4–10.5)
Chloride: 107 mEq/L (ref 96–112)
Glucose, Bld: 95 mg/dL (ref 70–99)
Sodium: 142 mEq/L (ref 135–145)
Total Protein: 6.4 g/dL (ref 6.0–8.3)

## 2010-05-29 LAB — CBC & DIFF AND RETIC
Basophils Absolute: 0 10*3/uL (ref 0.0–0.1)
Eosinophils Absolute: 0 10*3/uL (ref 0.0–0.5)
HCT: 32.4 % — ABNORMAL LOW (ref 34.8–46.6)
Immature Retic Fract: 3.3 % (ref 0.00–10.70)
LYMPH%: 32 % (ref 14.0–49.7)
MCV: 89.3 fL (ref 79.5–101.0)
MONO#: 0.5 10*3/uL (ref 0.1–0.9)
MONO%: 7.6 % (ref 0.0–14.0)
NEUT#: 4 10*3/uL (ref 1.5–6.5)
NEUT%: 59.7 % (ref 38.4–76.8)
Platelets: 176 10*3/uL (ref 145–400)
RBC: 3.63 10*6/uL — ABNORMAL LOW (ref 3.70–5.45)
WBC: 6.7 10*3/uL (ref 3.9–10.3)
nRBC: 0 % (ref 0–0)

## 2010-05-29 LAB — LACTATE DEHYDROGENASE: LDH: 145 U/L (ref 94–250)

## 2010-05-29 LAB — IRON AND TIBC: TIBC: 285 ug/dL (ref 250–470)

## 2010-08-17 ENCOUNTER — Emergency Department (HOSPITAL_COMMUNITY)
Admission: EM | Admit: 2010-08-17 | Discharge: 2010-08-17 | Payer: Self-pay | Source: Home / Self Care | Admitting: Emergency Medicine

## 2010-08-17 ENCOUNTER — Encounter: Payer: Self-pay | Admitting: Oncology

## 2010-08-24 LAB — CONVERTED CEMR LAB
Folate: 7.9 ng/mL
Iron: 48 ug/dL (ref 42–145)
Pap Smear: NEGATIVE
Saturation Ratios: 13.3 % — ABNORMAL LOW (ref 20.0–50.0)
Transferrin: 258.3 mg/dL (ref 212.0–360.0)
Vitamin B-12: 443 pg/mL (ref 211–911)

## 2010-08-28 NOTE — Assessment & Plan Note (Signed)
Summary: CPX-PAP//CCM   Vital Signs:  Patient profile:   60 year old female Menstrual status:  postmenopausal Height:      61 inches Weight:      136 pounds BMI:     25.79 Temp:     98.5 degrees F oral BP sitting:   130 / 84  (left arm) Cuff size:   regular  Vitals Entered By: Kern Reap CMA Duncan Dull) (October 21, 2009 3:53 PM) CC: cpx     Menstrual Status postmenopausal Last PAP Result NEGATIVE FOR INTRAEPITHELIAL LESIONS OR MALIGNANCY.   CC:  cpx.  History of Present Illness: Chelsea Beasley is a 60 year old, married female, nonsmoker, who comes in today for physical examination because of underlying breast cancer.  Chest total left mastectomy many years ago.  She is on tamoxifen and follows Dr. Donnie Coffin.  Every 6 months.  She continues to do well and asymptomatic.  He been complaining for the past 6 months of intermittent left chest wall pain.  She describes as sometimes sharp sometimes dull.  It comes and goes.  It is not changed over the past couple months.  She said no fever, weight loss, cough, etc..  She gets routine eye care.  Dental care, does BSE monthly, gets annual mammography.  Colonoscopy normal GI tetanus 2004  Allergies: No Known Drug Allergies  Past History:  Past medical, surgical, family and social histories (including risk factors) reviewed, and no changes noted (except as noted below).  Past Medical History: Reviewed history from 09/24/2008 and no changes required. appendectomy childbirth x 2 left mastectomy, total 2006 for breast cancer.  No reconstruction carpal tunnel surgery right wrist Anemia-NOS  Past Surgical History: Reviewed history from 09/24/2008 and no changes required. Mastectomy  Family History: Reviewed history from 09/23/2007 and no changes required. father died 72, CNS aneurysm mother 55, plus hypertension 3 brothers, one died of a CNS aneurysm.  One died of HIV.  The other in good health two sisters one diabetic ovarian cancer.  The  other in good health  Social History: Reviewed history from 09/23/2007 and no changes required. Occupation: Producer, television/film/video Married Never Smoked Alcohol use-no Drug use-no Regular exercise-yes  Review of Systems      See HPI  Physical Exam  General:  Well-developed,well-nourished,in no acute distress; alert,appropriate and cooperative throughout examination Head:  Normocephalic and atraumatic without obvious abnormalities. No apparent alopecia or balding. Eyes:  No corneal or conjunctival inflammation noted. EOMI. Perrla. Funduscopic exam benign, without hemorrhages, exudates or papilledema. Vision grossly normal. Ears:  External ear exam shows no significant lesions or deformities.  Otoscopic examination reveals clear canals, tympanic membranes are intact bilaterally without bulging, retraction, inflammation or discharge. Hearing is grossly normal bilaterally. Nose:  External nasal examination shows no deformity or inflammation. Nasal mucosa are pink and moist without lesions or exudates. Mouth:  Oral mucosa and oropharynx without lesions or exudates.  Teeth in good repair. Neck:  No deformities, masses, or tenderness noted. Chest Wall:  No deformities, masses, or tenderness noted. Breasts:  left breast surgically removed.  The scar, well-healed no palpable tenderness right breast diffuse fibrocystic changes Lungs:  Normal respiratory effort, chest expands symmetrically. Lungs are clear to auscultation, no crackles or wheezes. Heart:  Normal rate and regular rhythm. S1 and S2 normal without gallop, murmur, click, rub or other extra sounds. Abdomen:  Bowel sounds positive,abdomen soft and non-tender without masses, organomegaly or hernias noted. Rectal:  No external abnormalities noted. Normal sphincter tone. No rectal masses or tenderness. Genitalia:  Normal introitus for age, no external lesions, no vaginal discharge, mucosa pink and moist, no vaginal or cervical lesions, no vaginal  atrophy, no friaility or hemorrhage, normal uterus size and position, no adnexal masses or tenderness Msk:  No deformity or scoliosis noted of thoracic or lumbar spine.   Pulses:  R and L carotid,radial,femoral,dorsalis pedis and posterior tibial pulses are full and equal bilaterally Extremities:  No clubbing, cyanosis, edema, or deformity noted with normal full range of motion of all joints.   Neurologic:  No cranial nerve deficits noted. Station and gait are normal. Plantar reflexes are down-going bilaterally. DTRs are symmetrical throughout. Sensory, motor and coordinative functions appear intact. Skin:  Intact without suspicious lesions or rashes Cervical Nodes:  No lymphadenopathy noted Axillary Nodes:  No palpable lymphadenopathy Inguinal Nodes:  No significant adenopathy Psych:  Cognition and judgment appear intact. Alert and cooperative with normal attention span and concentration. No apparent delusions, illusions, hallucinations   Impression & Recommendations:  Problem # 1:  ANEMIA-NOS (ICD-285.9) Assessment Unchanged  Her updated medication list for this problem includes:    Feogen Caps (Iron combinations) .Marland Kitchen... Take 1 tablet by mouth once a day  Problem # 2:  HEALTH SCREENING (ICD-V70.0) Assessment: Unchanged  Orders: EKG w/ Interpretation (93000) EKG w/ Interpretation (93000)  Problem # 3:  CHEST PAIN, ATYPICAL (ICD-786.59) Assessment: New  Orders: EKG w/ Interpretation (93000) EKG w/ Interpretation (93000) T-2 View CXR (71020TC)  Complete Medication List: 1)  Tamoxifen Citrate 20 Mg Tabs (Tamoxifen citrate) .... Take 1 tablet by mouth once a day 2)  Feogen Caps (Iron combinations) .... Take 1 tablet by mouth once a day  Patient Instructions: 1)  go to the main office tomorrow for a chest x-ray.  I will call her the report 2)  Please schedule a follow-up appointment in 1 year. 3)  Please schedule a follow-up appointment as needed. 4)  It is important that you  exercise regularly at least 20 minutes 5 times a week. If you develop chest pain, have severe difficulty breathing, or feel very tired , stop exercising immediately and seek medical attention. 5)  Schedule your mammogram. 6)  Schedule a colonoscopy/sigmoidoscopy to help detect colon cancer. 7)  Take calcium +Vitamin D daily. 8)  Take an Aspirin every day.

## 2010-08-28 NOTE — Progress Notes (Signed)
Summary: Phone call completed  Phone Note Call from Patient Call back at Work Phone [phone]   Caller: Patient Summary of Call: Needs copy of records(labwork and cpx) faxed to job.  fax # [phone] Called again.  Rudy Jew, RN  February 11, 2010 9:19 AM  Initial call taken by: Trixie Dredge,  February 10, 2010 3:07 PM  Follow-up for Phone Call        Phone Call Completed

## 2010-08-28 NOTE — Letter (Signed)
Summary: Health Provider Screening Form/[facility]  Health Provider Screening Form/[facility]   Imported By: Maryln Gottron 03/12/2010 10:42:52  _____________________________________________________________________  External Attachment:    Type:   Image     Comment:   External Document

## 2010-08-28 NOTE — Progress Notes (Signed)
Summary: Pt called re: form she dropped by to be completed  Phone Note Call from Patient Call back at Work Phone [phone]   Caller: Patient Summary of Call: Pt called and dropped a form for Human Resources at pts job, re: pts lab and cpx that was done in April. Pls complete, sign and fax back to [phone] attn Deirdre Evener.    Initial call taken by: Lucy Antigua,  March 07, 2010 10:21 AM  Follow-up for Phone Call        faxed. Follow-up by: Warnell Forester,  March 10, 2010 9:34 AM

## 2010-10-12 LAB — COMPREHENSIVE METABOLIC PANEL
Albumin: 4.2 g/dL (ref 3.5–5.2)
Alkaline Phosphatase: 50 U/L (ref 39–117)
BUN: 15 mg/dL (ref 6–23)
CO2: 27 mEq/L (ref 19–32)
Chloride: 107 mEq/L (ref 96–112)
Potassium: 3.8 mEq/L (ref 3.5–5.1)
Total Bilirubin: 0.7 mg/dL (ref 0.3–1.2)

## 2010-10-12 LAB — APTT: aPTT: 28 seconds (ref 24–37)

## 2010-10-12 LAB — CBC
HCT: 34 % — ABNORMAL LOW (ref 36.0–46.0)
Hemoglobin: 11.4 g/dL — ABNORMAL LOW (ref 12.0–15.0)
MCV: 91.3 fL (ref 78.0–100.0)
Platelets: 172 10*3/uL (ref 150–400)
RBC: 3.73 MIL/uL — ABNORMAL LOW (ref 3.87–5.11)
WBC: 5.8 10*3/uL (ref 4.0–10.5)

## 2010-10-12 LAB — DIFFERENTIAL
Basophils Absolute: 0.1 10*3/uL (ref 0.0–0.1)
Basophils Relative: 1 % (ref 0–1)
Eosinophils Relative: 1 % (ref 0–5)
Monocytes Absolute: 0.4 10*3/uL (ref 0.1–1.0)
Neutro Abs: 3.4 10*3/uL (ref 1.7–7.7)

## 2010-10-12 LAB — PROTIME-INR: Prothrombin Time: 13.9 seconds (ref 11.6–15.2)

## 2010-10-12 LAB — URINALYSIS, ROUTINE W REFLEX MICROSCOPIC
Ketones, ur: 15 mg/dL — AB
Nitrite: NEGATIVE
Protein, ur: NEGATIVE mg/dL
Urobilinogen, UA: 1 mg/dL (ref 0.0–1.0)

## 2010-10-13 ENCOUNTER — Other Ambulatory Visit: Payer: Self-pay

## 2010-10-14 ENCOUNTER — Other Ambulatory Visit: Payer: Self-pay

## 2010-10-22 ENCOUNTER — Encounter: Payer: Self-pay | Admitting: Family Medicine

## 2010-10-22 ENCOUNTER — Other Ambulatory Visit: Payer: Self-pay | Admitting: Family Medicine

## 2010-10-23 ENCOUNTER — Other Ambulatory Visit (HOSPITAL_COMMUNITY)
Admission: RE | Admit: 2010-10-23 | Discharge: 2010-10-23 | Disposition: A | Discharge status: Home / Self Care | Payer: Managed Care, Other (non HMO) | Source: Ambulatory Visit | Attending: Family Medicine | Admitting: Family Medicine

## 2010-10-23 ENCOUNTER — Ambulatory Visit (INDEPENDENT_AMBULATORY_CARE_PROVIDER_SITE_OTHER): Payer: Managed Care, Other (non HMO) | Admitting: Family Medicine

## 2010-10-23 ENCOUNTER — Encounter: Payer: Self-pay | Admitting: Family Medicine

## 2010-10-23 DIAGNOSIS — R209 Unspecified disturbances of skin sensation: Secondary | ICD-10-CM

## 2010-10-23 DIAGNOSIS — R202 Paresthesia of skin: Secondary | ICD-10-CM

## 2010-10-23 DIAGNOSIS — Z0000 Encounter for general adult medical examination without abnormal findings: Secondary | ICD-10-CM

## 2010-10-23 DIAGNOSIS — Z01419 Encounter for gynecological examination (general) (routine) without abnormal findings: Secondary | ICD-10-CM | POA: Insufficient documentation

## 2010-10-23 DIAGNOSIS — N950 Postmenopausal bleeding: Secondary | ICD-10-CM

## 2010-10-23 DIAGNOSIS — Z9010 Acquired absence of unspecified breast and nipple: Secondary | ICD-10-CM

## 2010-10-23 LAB — POCT URINALYSIS DIPSTICK
Bilirubin, UA: NEGATIVE
Nitrite, UA: NEGATIVE
Protein, UA: NEGATIVE
pH, UA: 6

## 2010-10-23 NOTE — Patient Instructions (Signed)
Call Dr. Barnet Pall, [facility] for consultation regarding the postmenopausal bleeding.  Call Dr. Cleone Slim sypher, hand surgeon, for evaluation of numbness in her left hand in the meantime wear the splint continuously  Return in one year, sooner if any problems

## 2010-10-23 NOTE — Progress Notes (Signed)
  Subjective:    Patient ID: Chelsea Beasley, female    DOB: July 02, 1951, 60 y.o.   MRN: [id number]  HPIphillip Is a delightful, 60 year old female, nonsmoker, who comes in today for general physical examination because a history of left-sided breast cancer and now postmenopausal bleeding.  Five years ago.  She was found breast cancer, total left mastectomy.  No reconstruction.  She was on tamoxifen for 5 years.  This was discontinued by Dr. Donnie Beasley in the fall.  She's been disease-free.  In January.  She had a 12 day period.  She got had a period for 8 years.  She's also had numbness in her left hand for about two months.  Previously had numbness in her right hand had surgery for carpal tunnel syndrome.  Years ago    Review of Systems  Constitutional: Negative.   HENT: Negative.   Eyes: Negative.   Respiratory: Negative.   Cardiovascular: Negative.   Gastrointestinal: Negative.   Genitourinary: Negative.   Musculoskeletal: Negative.   Neurological: Negative.   Hematological: Negative.   Psychiatric/Behavioral: Negative.        Objective:   Physical Exam  Constitutional: She appears well-developed and well-nourished.  HENT:  Head: Normocephalic and atraumatic.  Right Ear: External ear normal.  Left Ear: External ear normal.  Nose: Nose normal.  Mouth/Throat: Oropharynx is clear and moist.  Eyes: EOM are normal. Pupils are equal, round, and reactive to light.  Neck: Normal range of motion. Neck supple. No thyromegaly present.  Cardiovascular: Normal rate, regular rhythm, normal heart sounds and intact distal pulses.  Exam reveals no gallop and no friction rub.   No murmur heard. Pulmonary/Chest: Effort normal and breath sounds normal.  Abdominal: Soft. Bowel sounds are normal. She exhibits no distension and no mass. There is no tenderness. There is no rebound.  Genitourinary: Vagina normal and uterus normal. Guaiac negative stool. No vaginal discharge found.       The left  breast has been surgically removed.  The scar is well healed and normal.  No palpable masses in the axilla.  Right breast normal except for multiple cystic lesions  Musculoskeletal: Normal range of motion.  Lymphadenopathy:    She has no cervical adenopathy.  Neurological: She is alert. She has normal reflexes. No cranial nerve deficit. She exhibits normal muscle tone. Coordination normal.  Skin: Skin is warm and dry.  Psychiatric: She has a normal mood and affect. Her behavior is normal. Judgment and thought content normal.          Assessment & Plan:  Is 5 years status post left breast cancer.  Numbness left hand probable carpal tunnel syndrome.  Refer to Dr. Margaree Beasley.  Postmenopausal bleeding referred to Dr. Arlyce Beasley

## 2010-10-24 LAB — CBC WITH DIFFERENTIAL/PLATELET
Basophils Relative: 1.6 % (ref 0.0–3.0)
Eosinophils Absolute: 0.1 10*3/uL (ref 0.0–0.7)
Lymphocytes Relative: 31.9 % (ref 12.0–46.0)
MCHC: 33.3 g/dL (ref 30.0–36.0)
Monocytes Relative: 7.7 % (ref 3.0–12.0)
Neutrophils Relative %: 57.9 % (ref 43.0–77.0)
RBC: 3.72 Mil/uL — ABNORMAL LOW (ref 3.87–5.11)
WBC: 6.5 10*3/uL (ref 4.5–10.5)

## 2010-10-24 LAB — HEPATIC FUNCTION PANEL
ALT: 22 U/L (ref 0–35)
AST: 24 U/L (ref 0–37)
Albumin: 4 g/dL (ref 3.5–5.2)

## 2010-10-24 LAB — LIPID PANEL
HDL: 83.3 mg/dL (ref >39.00)
Total CHOL/HDL Ratio: 2
Triglycerides: 41 mg/dL (ref 0.0–149.0)
VLDL: 8.2 mg/dL (ref 0.0–40.0)

## 2010-10-24 LAB — BASIC METABOLIC PANEL
CO2: 29 mEq/L (ref 19–32)
Calcium: 9.5 mg/dL (ref 8.4–10.5)
Creatinine, Ser: 0.7 mg/dL (ref 0.4–1.2)
GFR: 118.2 mL/min (ref >60.00)
Sodium: 139 mEq/L (ref 135–145)

## 2010-10-29 NOTE — Progress Notes (Signed)
patient  Is aware 

## 2010-11-19 ENCOUNTER — Other Ambulatory Visit: Payer: Self-pay | Admitting: Family Medicine

## 2010-11-19 ENCOUNTER — Ambulatory Visit
Admission: RE | Admit: 2010-11-19 | Discharge: 2010-11-19 | Disposition: A | Discharge status: Home / Self Care | Payer: Managed Care, Other (non HMO) | Source: Ambulatory Visit | Attending: Family Medicine | Admitting: Family Medicine

## 2010-12-12 NOTE — Op Note (Signed)
Chelsea Beasley, Chelsea Beasley               ACCOUNT NO.:  [id number]   MEDICAL RECORD NO.:  [id number]          PATIENT TYPE:  AMB   LOCATION:  [facility]SC                          FACILITY:  [facility]   PHYSICIAN:  Currie Paris, M.D.DATE OF BIRTH:  [birth year]   DATE OF PROCEDURE:  06/04/2005  DATE OF DISCHARGE:                                 OPERATIVE REPORT   OFFICE MEDICAL RECORD NUMBER:  [id number]   PREOPERATIVE DIAGNOSIS:  Cancer of left breast, probably lobular, upper-  outer quadrant and also involving lower-outer quadrant.   POSTOPERATIVE DIAGNOSIS:  Cancer of left breast, probably lobular, upper-  outer quadrant and also involving lower-outer quadrant.   OPERATION:  Left total mastectomy with blue dye injection and axillary  sentinel lymph node biopsy.   SURGEON:  Currie Paris, M.D.   ASSISTANT:  Leonie Man, M.D.   ANESTHESIA:  General endotracheal.   CLINICAL HISTORY:  This is a 54-year lady who has presented with a large  left breast mass which showed some skin dimpling.  Most the mass seemed to  be in the upper-outer quadrant, but close to the 3 o'clock position and the  skin dimpling was actually at about the 4 o'clock position.  After  preoperative evaluation, she elected to proceed to total mastectomy with  sentinel node evaluation.   DESCRIPTION OF PROCEDURE:  The patient was seen in the holding area and she  had no further questions.  We confirmed the plans for the procedure as  outlined above.  The left breast was marked by the patient and by myself as  the operative site.  She had already had her radioactive isotope injection.   The patient was taken to the operating room and after satisfactory general  anesthesia had been obtained, the left nipple-areolar area was prepped.  The  time-out occurred.  I injected 5 mL of dilute methylene blue dye.  This was  massaged in.  Using a Neoprobe device-type, we identified a hot area in the  axilla and marked it.   The entire breast was prepped and draped as a sterile field.  The  inframammary fold was marked for orientation purposes.  An elliptical  incision was outlined, being sure to take the skin over the dimpled area.  The superior half of the incision was made and a superior flap raised  medially to the sternum, superiorly to the clavicle and lateral laterally  out into the axillary area and over the edge of the pectoralis.   Using the Neoprobe, I could see a hot area, but almost immediately before  doing any further evaluation, I could see a blue lymphatic leading to a blue  lymph node.  This excised.  It had very low counts.  Using a Neoprobe, I  found 2 additional nodes that were a little bit higher up, both of which on  further dissection evaluation were blue and had counts, one of about 800,  the other of about 1500.  These were a little bit higher in the axilla than  the first one.  The primary that had been initially marked was  then  identified and this was a little bit lower in the axilla and actually more  proximal than the first node that I had removed.  Further dissection  revealed this to a large blue node that had counts of about 2500.  This was  likewise removed.  Once this was out, there were no blue nodes, no blue  lymphatics, no palpable adenopathy and no areas that had a background count  of about 25.   The attention was returned back to the breast.  The inferior skin incision  was made and the inferior flap raised to the inframammary fold and just a  little beyond and then laterally to the latissimus.  The breast was removed  from medial-to-lateral using cautery and taking the fascia.  The final  lateral attachments towards the axilla were divided.  Further evaluation by  palpation and inspection of the axilla showed no evidence of any other  missed adenopathy.  The area was irrigated and once I was sure everything  was dry, I went ahead and placed two 19 Blake drains.  At  about this point,  Pathology reported that the sentinel nodes were negative.   I had discussed with the patient the fact that she likely has lobular which  does have somewhat of a false-negative touch prep evaluation, but given no  further clinically abnormal nodes, I elected not to do anything further at  this point with a negative sentinel node by touch prep.   We irrigated again and again checked for hemostasis.  I injected a little  Marcaine around what looked like the second intercostal brachial nerve.  I  then closed the wound with staples.  I left about 25 mL of Marcaine under  the flaps for approximately 10 minutes and then connected the drains to  suction.   The patient tolerated the procedure well.  There were no operative  complications.  All counts were correct.      Currie Paris, M.D.  Electronically Signed     CJS/MEDQ  D:  06/04/2005  T:  06/05/2005  Job:  [id number]

## 2010-12-15 ENCOUNTER — Ambulatory Visit (INDEPENDENT_AMBULATORY_CARE_PROVIDER_SITE_OTHER): Payer: Managed Care, Other (non HMO) | Admitting: Family Medicine

## 2010-12-15 DIAGNOSIS — Z0000 Encounter for general adult medical examination without abnormal findings: Secondary | ICD-10-CM

## 2010-12-23 ENCOUNTER — Ambulatory Visit (INDEPENDENT_AMBULATORY_CARE_PROVIDER_SITE_OTHER): Payer: Managed Care, Other (non HMO) | Admitting: Family Medicine

## 2010-12-23 DIAGNOSIS — Z0000 Encounter for general adult medical examination without abnormal findings: Secondary | ICD-10-CM

## 2010-12-23 DIAGNOSIS — Z111 Encounter for screening for respiratory tuberculosis: Secondary | ICD-10-CM

## 2011-10-12 ENCOUNTER — Other Ambulatory Visit: Payer: Self-pay | Admitting: Family Medicine

## 2011-10-12 ENCOUNTER — Ambulatory Visit (INDEPENDENT_AMBULATORY_CARE_PROVIDER_SITE_OTHER): Payer: Managed Care, Other (non HMO) | Admitting: *Deleted

## 2011-10-12 DIAGNOSIS — Z9012 Acquired absence of left breast and nipple: Secondary | ICD-10-CM

## 2011-10-12 DIAGNOSIS — Z1231 Encounter for screening mammogram for malignant neoplasm of breast: Secondary | ICD-10-CM

## 2011-10-12 DIAGNOSIS — Z111 Encounter for screening for respiratory tuberculosis: Secondary | ICD-10-CM

## 2011-10-14 LAB — TB SKIN TEST
Induration: 0
TB Skin Test: NEGATIVE mm

## 2011-10-16 ENCOUNTER — Ambulatory Visit
Admission: RE | Admit: 2011-10-16 | Discharge: 2011-10-16 | Disposition: A | Discharge status: Home / Self Care | Payer: Managed Care, Other (non HMO) | Source: Ambulatory Visit | Attending: Family Medicine | Admitting: Family Medicine

## 2011-10-16 DIAGNOSIS — Z9012 Acquired absence of left breast and nipple: Secondary | ICD-10-CM

## 2011-10-16 DIAGNOSIS — Z1231 Encounter for screening mammogram for malignant neoplasm of breast: Secondary | ICD-10-CM

## 2011-11-06 ENCOUNTER — Encounter: Payer: Self-pay | Admitting: Family Medicine

## 2011-11-06 ENCOUNTER — Other Ambulatory Visit (HOSPITAL_COMMUNITY)
Admission: RE | Admit: 2011-11-06 | Discharge: 2011-11-06 | Disposition: A | Discharge status: Home / Self Care | Payer: Managed Care, Other (non HMO) | Source: Ambulatory Visit | Attending: Family Medicine | Admitting: Family Medicine

## 2011-11-06 ENCOUNTER — Ambulatory Visit (INDEPENDENT_AMBULATORY_CARE_PROVIDER_SITE_OTHER): Payer: Managed Care, Other (non HMO) | Admitting: Family Medicine

## 2011-11-06 VITALS — BP 120/84

## 2011-11-06 DIAGNOSIS — Z0000 Encounter for general adult medical examination without abnormal findings: Secondary | ICD-10-CM

## 2011-11-06 DIAGNOSIS — Z01419 Encounter for gynecological examination (general) (routine) without abnormal findings: Secondary | ICD-10-CM | POA: Insufficient documentation

## 2011-11-06 DIAGNOSIS — Z9010 Acquired absence of unspecified breast and nipple: Secondary | ICD-10-CM

## 2011-11-06 DIAGNOSIS — D649 Anemia, unspecified: Secondary | ICD-10-CM

## 2011-11-06 LAB — BASIC METABOLIC PANEL
BUN: 11 mg/dL (ref 6–23)
Calcium: 9.5 mg/dL (ref 8.4–10.5)
Creatinine, Ser: 0.7 mg/dL (ref 0.4–1.2)
GFR: 115.76 mL/min (ref >60.00)
Glucose, Bld: 75 mg/dL (ref 70–99)
Sodium: 139 mEq/L (ref 135–145)

## 2011-11-06 LAB — CBC
HCT: 34.1 % — ABNORMAL LOW (ref 36.0–46.0)
Hemoglobin: 11 g/dL — ABNORMAL LOW (ref 12.0–15.0)
MCHC: 32.4 g/dL (ref 30.0–36.0)
MCV: 89.1 fl (ref 78.0–100.0)
RDW: 12.1 % (ref 11.5–14.6)

## 2011-11-06 LAB — LIPID PANEL: HDL: 85.6 mg/dL (ref >39.00)

## 2011-11-06 LAB — POCT URINALYSIS DIPSTICK
Nitrite, UA: NEGATIVE
Protein, UA: NEGATIVE
pH, UA: 5

## 2011-11-06 LAB — HEPATIC FUNCTION PANEL
ALT: 17 U/L (ref 0–35)
AST: 22 U/L (ref 0–37)
Alkaline Phosphatase: 50 U/L (ref 39–117)
Bilirubin, Direct: 0 mg/dL (ref 0.0–0.3)
Total Bilirubin: 0.2 mg/dL — ABNORMAL LOW (ref 0.3–1.2)

## 2011-11-06 NOTE — Patient Instructions (Signed)
Continue your good health habits  Check your right breast monthly if you have any questions return  And you mammography  Return in April for your annual exam sooner if any problems

## 2011-11-06 NOTE — Progress Notes (Signed)
  Subjective:    Patient ID: Chelsea Beasley, female    DOB: January 23, 1951, 61 y.o.   MRN: [id number]  HPI Chelsea Beasley is a delightful 61 year old female nonsmoker who comes in today for general medical examination because of a history of breast cancer  In 2006 she noticed dimpling of her left breast Chelsea Beasley had a mastectomy no reconstruction she wears a prosthesis. She has fibrocystic changes in her other breast. Mammogram February 2013 normal. She does BSE monthly  She gets routine eye care, dental care, BSE monthly, and you mammography, colonoscopy in her early 80s normal tetanus booster up-to-date   Review of Systems  Constitutional: Negative.   HENT: Negative.   Eyes: Negative.   Respiratory: Negative.   Cardiovascular: Negative.   Gastrointestinal: Negative.   Genitourinary: Negative.   Musculoskeletal: Negative.   Neurological: Negative.   Hematological: Negative.   Psychiatric/Behavioral: Negative.        Objective:   Physical Exam  Constitutional: She appears well-developed and well-nourished.  HENT:  Head: Normocephalic and atraumatic.  Right Ear: External ear normal.  Left Ear: External ear normal.  Nose: Nose normal.  Mouth/Throat: Oropharynx is clear and moist.  Eyes: EOM are normal. Pupils are equal, round, and reactive to light.  Neck: Normal range of motion. Neck supple. No thyromegaly present.  Cardiovascular: Normal rate, regular rhythm, normal heart sounds and intact distal pulses.  Exam reveals no gallop and no friction rub.   No murmur heard. Pulmonary/Chest: Effort normal and breath sounds normal.  Abdominal: Soft. Bowel sounds are normal. She exhibits no distension and no mass. There is no tenderness. There is no rebound.  Genitourinary: Vagina normal and uterus normal. Guaiac negative stool. No vaginal discharge found.       There is a scar right upper chest wall from previous mastectomy a suture line is normal no palpable abnormalities axillary exam  normal  The right breast is symmetrical no skin changes no nipple discharge or rash she has multiple small movable fibrocystic changes throughout this breast none are dominant  Musculoskeletal: Normal range of motion.  Lymphadenopathy:    She has no cervical adenopathy.  Neurological: She is alert. She has normal reflexes. No cranial nerve deficit. She exhibits normal muscle tone. Coordination normal.  Skin: Skin is warm and dry.  Psychiatric: She has a normal mood and affect. Her behavior is normal. Judgment and thought content normal.          Assessment & Plan:  Healthy female  9 years status post a left breast cancer mastectomy asymptomatic  Fibrocystic changes right breast BSE monthly and you mammography CS if any problems or questions

## 2011-11-16 ENCOUNTER — Other Ambulatory Visit: Payer: Managed Care, Other (non HMO)

## 2011-11-23 ENCOUNTER — Encounter: Payer: Managed Care, Other (non HMO) | Admitting: Family Medicine

## 2011-12-15 ENCOUNTER — Telehealth: Payer: Self-pay | Admitting: Family Medicine

## 2011-12-15 MED ORDER — HYDROCODONE-HOMATROPINE 5-1.5 MG/5ML PO SYRP: 5.0000 mL | ORAL_SOLUTION | Freq: Three times a day (TID) | ORAL | Status: AC | PRN

## 2011-12-15 NOTE — Telephone Encounter (Signed)
Hydromet 4 ounces directions 1/2-1 teaspoon each bedtime when necessary cough refills x1

## 2011-12-15 NOTE — Telephone Encounter (Signed)
Patient called stating that she was given prilosec and it is not helping so she would like the MD to call her in a cough syrup. Please advise.

## 2011-12-15 NOTE — Telephone Encounter (Signed)
Please let the patient know that the prescription has been called in.

## 2011-12-24 ENCOUNTER — Telehealth: Payer: Self-pay | Admitting: *Deleted

## 2011-12-24 DIAGNOSIS — R05 Cough: Secondary | ICD-10-CM

## 2011-12-24 NOTE — Telephone Encounter (Signed)
Patient is aware. Xray order sent and referral request sent.

## 2011-12-24 NOTE — Telephone Encounter (Signed)
Chelsea Beasley send her for a chest x-ray and a pulmonary consult

## 2011-12-24 NOTE — Telephone Encounter (Signed)
Voice mail-pt calling c/o chronic cough- has to continue to take cough medicine .  Concerned because had breast cancer in 2006--pt asks if she needs referral for chronic cough?

## 2011-12-25 ENCOUNTER — Ambulatory Visit (INDEPENDENT_AMBULATORY_CARE_PROVIDER_SITE_OTHER)
Admission: RE | Admit: 2011-12-25 | Discharge: 2011-12-25 | Disposition: A | Discharge status: Home / Self Care | Payer: Managed Care, Other (non HMO) | Source: Ambulatory Visit | Attending: Family Medicine | Admitting: Family Medicine

## 2011-12-25 DIAGNOSIS — R05 Cough: Secondary | ICD-10-CM

## 2011-12-29 ENCOUNTER — Institutional Professional Consult (permissible substitution): Payer: Managed Care, Other (non HMO) | Admitting: Pulmonary Disease

## 2011-12-29 ENCOUNTER — Telehealth: Payer: Self-pay | Admitting: *Deleted

## 2011-12-29 DIAGNOSIS — R05 Cough: Secondary | ICD-10-CM

## 2011-12-29 NOTE — Telephone Encounter (Signed)
Patient is aware of x-ray.  She still has a cough.  She is unable to keep her appointment with pulmonary but will reschedule.  Any suggestions?

## 2011-12-30 NOTE — Telephone Encounter (Signed)
C. pulmonary ASAP

## 2012-01-20 ENCOUNTER — Encounter: Payer: Self-pay | Admitting: Pulmonary Disease

## 2012-01-20 ENCOUNTER — Ambulatory Visit (INDEPENDENT_AMBULATORY_CARE_PROVIDER_SITE_OTHER): Payer: Managed Care, Other (non HMO) | Admitting: Pulmonary Disease

## 2012-01-20 VITALS — BP 116/68 | HR 86 | Temp 99.0°F | Ht 68.0 in | Wt 132.0 lb

## 2012-01-20 DIAGNOSIS — R05 Cough: Secondary | ICD-10-CM

## 2012-01-20 MED ORDER — TRAMADOL HCL 50 MG PO TABS
50.0000 mg | ORAL_TABLET | Freq: Four times a day (QID) | ORAL | Status: AC | PRN
Start: 2012-01-20 — End: 2012-01-30

## 2012-01-20 MED ORDER — OMEPRAZOLE 40 MG PO CPDR: 40.0000 mg | DELAYED_RELEASE_CAPSULE | Freq: Every morning | ORAL | Status: DC

## 2012-01-20 NOTE — Assessment & Plan Note (Signed)
The patient has had a dry chronic cough of 3 month duration that I suspect is upper airway in origin.  She has no history for pulmonary disease, has clear lung fields, and a recent clear chest x-ray.  From her description, she is having a lot of throat clearing and describes cyclical coughing as well.  I have explained to her all of the common causes of upper airway cough, and answered all of her questions.  I would like to treat her for postnasal drip and reflux since these can manifest simply has chronic cough.  I have also reviewed the behavioral therapies to prevent cyclical coughing.  We'll use tramadol to help with cough suppression until the next visit.

## 2012-01-20 NOTE — Patient Instructions (Addendum)
No throat clearing!!  Use hard candy ( no mint or menthol) to bathe back of throat.  Keep in mouth from sunup to sundown. Minimize voice use.  No prolonged conversation, no singing or yelling. Will start on omeprazole 40mg  one each am. Get chlorpheniramine 4mg  otc, and take 2 at bedtime and one at lunch everyday for next 2 weeks. Can take tramadol 50mg  one every 6 hrs for cough is needed.  Please call me in 2 weeks with your response to therapy.  We are looking for improvement, no necessarily resolution.

## 2012-01-20 NOTE — Progress Notes (Signed)
  Subjective:    Patient ID: Chelsea Beasley, female    DOB: 1950/12/17, 61 y.o.   MRN: [id number]  HPI The patient is a 61 year old female who I've been asked to see for chronic cough.  The patient has had issues with chronic cough in the past, but this particular episode started approximately 3 months ago.  She denies any viral URI prodrome or sinus issues, and simply had an escalating dry cough.  She does note a significant globus sensation, as well as frequent throat clearing.  She denies any postnasal drip or sinus symptoms, and also denies reflux symptoms.  She has no history of breathing issues in the past or asthma.  Her cough can come in paroxysms, and can lead to gagging.  The only thing that has made it better is a cough suppressant.  It is usually worse with increased conversation, and also at night on lying down.  The patient has had a recent chest x-ray that was unremarkable, and although she has a history of breast cancer, she has had no history of recurrence.   Review of Systems  Constitutional: Negative for fever and unexpected weight change.  HENT: Positive for rhinorrhea. Negative for ear pain, nosebleeds, congestion, sore throat, sneezing, trouble swallowing, dental problem, postnasal drip and sinus pressure.   Eyes: Negative for redness and itching.  Respiratory: Positive for cough. Negative for chest tightness, shortness of breath and wheezing.   Cardiovascular: Negative for palpitations and leg swelling.  Gastrointestinal: Negative for nausea and vomiting.  Genitourinary: Negative for dysuria.  Musculoskeletal: Negative for joint swelling.  Skin: Negative for rash.  Neurological: Negative for headaches.  Hematological: Does not bruise/bleed easily.  Psychiatric/Behavioral: Negative for dysphoric mood. The patient is not nervous/anxious.        Objective:   Physical Exam Constitutional:  Well developed, no acute distress  HENT:  Nares patent without  discharge  Oropharynx without exudate, palate and uvula are normal  Eyes:  Perrla, eomi, no scleral icterus  Neck:  No JVD, no TMG  Cardiovascular:  Normal rate, regular rhythm, no rubs or gallops.  No murmurs        Intact distal pulses  Pulmonary :  Normal breath sounds, no stridor or respiratory distress   No rales, rhonchi, or wheezing  Abdominal:  Soft, nondistended, bowel sounds present.  No tenderness noted.   Musculoskeletal:  No lower extremity edema noted.  Lymph Nodes:  No cervical lymphadenopathy noted  Skin:  No cyanosis noted  Neurologic:  Alert, appropriate, moves all 4 extremities without obvious deficit.         Assessment & Plan:

## 2012-02-15 ENCOUNTER — Telehealth: Payer: Self-pay | Admitting: Pulmonary Disease

## 2012-02-15 MED ORDER — HYDROCOD POLST-CHLORPHEN POLST 10-8 MG/5ML PO LQCR: 5.0000 mL | Freq: Two times a day (BID) | ORAL | Status: DC | PRN

## 2012-02-15 NOTE — Telephone Encounter (Signed)
One time script for tussionex 5cc po q12h prn cough, disp 4oz, no refills.

## 2012-02-15 NOTE — Telephone Encounter (Signed)
Pt advised and rx sent. Jennifer Castillo, CMA  

## 2012-02-15 NOTE — Telephone Encounter (Signed)
I spoke with pt and she states her dry cough is no better and is now causing her to have HA. She has tried all of [facility] recs and nothing helped. She has been taking chlorpheniramine, tramadol, and omeprazole 40 mg w/o relief. She is requesting recs. Since [facility] is out of the office until wed will forward to doc of the day for recs. Please advise RB thanks  No Known Allergies

## 2012-02-22 ENCOUNTER — Telehealth: Payer: Self-pay | Admitting: Pulmonary Disease

## 2012-02-22 NOTE — Telephone Encounter (Signed)
lmomtcb x1 for pt 

## 2012-02-23 NOTE — Telephone Encounter (Signed)
LMTCB

## 2012-02-23 NOTE — Telephone Encounter (Signed)
I spoke with the pt and she states that her cough is not improved at all. She states she is keeping hard candy in her mouth at all times, but this is making her roof of her mouth sore. She also denies clearing her throat, and is sipping on water all day. She is also c/o hoarseness and states she coughs so much she is getting a headache as well. Pt states she is taking omeprazole in AM, tramadol as needed, chlorpheniramine 2 at bedtime and one at lunch, and is using cough syrup at night without any relief. Pt is concerned with her history of cancer that something else is going on that is causing the cough and she is asking for further testing to determine the cause. I advised the pt that I will get a message to [facility] to let him know what is going on. Pt states understanding. Please advise. Carron Curie, CMA No Known Allergies

## 2012-02-23 NOTE — Telephone Encounter (Signed)
Would like to image her sinuses to evaluate for chronic sinusitis.  If this is clear, would do testing to evaluate for "cough variant asthma".  See if she is ok with scan of sinus, and i can send an order.

## 2012-02-24 NOTE — Telephone Encounter (Signed)
lmomtcb  

## 2012-02-25 ENCOUNTER — Other Ambulatory Visit: Payer: Self-pay | Admitting: Pulmonary Disease

## 2012-02-25 DIAGNOSIS — R05 Cough: Secondary | ICD-10-CM

## 2012-02-25 NOTE — Telephone Encounter (Signed)
Spoke with the patient and she is ok to proceed with CT of sinuses. I will send message to let Dr. Shelle Iron know so he can place the order.Carron Curie, CMA

## 2012-02-26 NOTE — Telephone Encounter (Addendum)
Order placed by [facility] on 8.1.13.  CT scheduled for 8.5.13.

## 2012-02-26 NOTE — Telephone Encounter (Signed)
Put in yesterday, but thanks.

## 2012-02-26 NOTE — Telephone Encounter (Signed)
Will close message.  

## 2012-02-29 ENCOUNTER — Other Ambulatory Visit: Payer: Managed Care, Other (non HMO)

## 2012-03-02 ENCOUNTER — Inpatient Hospital Stay: Admission: RE | Admit: 2012-03-02 | Payer: Managed Care, Other (non HMO) | Source: Ambulatory Visit

## 2012-03-14 ENCOUNTER — Telehealth: Payer: Self-pay | Admitting: Pulmonary Disease

## 2012-03-14 NOTE — Telephone Encounter (Signed)
[id number], when was the CT? I see one ordered but not that it has been completed? Carron Curie, CMA

## 2012-03-14 NOTE — Telephone Encounter (Signed)
Called [facility] and spoke with Aurea Graff and requested CT sinus report to be sent to the triage fax line. Will await fax.

## 2012-03-14 NOTE — Telephone Encounter (Addendum)
Pt stated that the CT was completed on July 23 or July 24th at 2:45 pm.  Stated that it was completed at location on [address] in [place] stated this was probably [facility]).   Pt asked to be reached at [phone].  Antionette Fairy

## 2012-03-15 MED ORDER — AMOXICILLIN-POT CLAVULANATE 875-125 MG PO TABS: 1.0000 | ORAL_TABLET | Freq: Two times a day (BID) | ORAL | Status: AC

## 2012-03-15 NOTE — Telephone Encounter (Signed)
Spoke with Chelsea Beasley and notified of results/recs per Dr. Shelle Iron. Chelsea Beasley verbalized understanding and denied any questions. Rx for augmentin was sent to [facility], you do not have any thing open until 04-06-12. Is this date okay, or do you want to overbook for 2 wk f/u? Please advise, thanks!

## 2012-03-15 NOTE — Telephone Encounter (Signed)
CT report received from [facility].  Placed in [facility]'s "important lookat."    Left detailed message on pt's voice mail informing her that the report has been received for [facility] to lookat and we will call with results.  Advised pt to call back if she has any questions.  Will forward to [place].

## 2012-03-15 NOTE — Telephone Encounter (Signed)
I need to see her.  Get with Lawson Fiscal to work out a time.

## 2012-03-15 NOTE — Telephone Encounter (Signed)
Lori, please advise when to [facility] for 2 wks. Thanks!

## 2012-03-15 NOTE — Telephone Encounter (Signed)
lmomtcb x1 

## 2012-03-15 NOTE — Telephone Encounter (Signed)
Let pt know that we never received her ct report until today!! The ct showed some fluid in her right maxillary sinus, and suspect is related to a smoldering infection. Would like to treat her with augmentin 875mg  one twice a day for 10 days.  She needs to continue with her antihistamine, as well as once a day medication for reflux (is often caused by the actual coughing).  Also continue with behavioral therapies we discussed to prevent throat clearing and coughing Would like to see her in office in 2 weeks.

## 2012-03-16 NOTE — Telephone Encounter (Signed)
We can use any held 4:30pm slot except on Mondays.

## 2012-03-17 NOTE — Telephone Encounter (Signed)
Pt returned call.  Set app for 03/29/12 at 4:30.  Pt verbalized understanding & stated nothing further needed at this time.

## 2012-03-17 NOTE — Telephone Encounter (Signed)
lmomtcb  

## 2012-03-29 ENCOUNTER — Ambulatory Visit: Payer: Managed Care, Other (non HMO) | Admitting: Pulmonary Disease

## 2012-08-05 ENCOUNTER — Ambulatory Visit: Payer: Managed Care, Other (non HMO) | Admitting: Pulmonary Disease

## 2012-09-12 ENCOUNTER — Other Ambulatory Visit: Payer: Self-pay | Admitting: Family Medicine

## 2012-09-12 DIAGNOSIS — Z9012 Acquired absence of left breast and nipple: Secondary | ICD-10-CM

## 2012-09-12 DIAGNOSIS — Z1231 Encounter for screening mammogram for malignant neoplasm of breast: Secondary | ICD-10-CM

## 2012-10-17 ENCOUNTER — Ambulatory Visit
Admission: RE | Admit: 2012-10-17 | Discharge: 2012-10-17 | Disposition: A | Discharge status: Home / Self Care | Payer: Managed Care, Other (non HMO) | Source: Ambulatory Visit | Attending: Family Medicine | Admitting: Family Medicine

## 2012-10-17 DIAGNOSIS — Z9012 Acquired absence of left breast and nipple: Secondary | ICD-10-CM

## 2012-10-17 DIAGNOSIS — Z1231 Encounter for screening mammogram for malignant neoplasm of breast: Secondary | ICD-10-CM

## 2012-11-03 ENCOUNTER — Other Ambulatory Visit (INDEPENDENT_AMBULATORY_CARE_PROVIDER_SITE_OTHER): Payer: Managed Care, Other (non HMO)

## 2012-11-03 DIAGNOSIS — Z0000 Encounter for general adult medical examination without abnormal findings: Secondary | ICD-10-CM

## 2012-11-03 LAB — LIPID PANEL
Cholesterol: 211 mg/dL — ABNORMAL HIGH (ref 0–200)
HDL: 81.6 mg/dL (ref >39.00)
Triglycerides: 55 mg/dL (ref 0.0–149.0)
VLDL: 11 mg/dL (ref 0.0–40.0)

## 2012-11-03 LAB — POCT URINALYSIS DIPSTICK
Bilirubin, UA: NEGATIVE
Ketones, UA: NEGATIVE
Spec Grav, UA: <=1.005

## 2012-11-03 LAB — CBC WITH DIFFERENTIAL/PLATELET
Basophils Absolute: 0 10*3/uL (ref 0.0–0.1)
Eosinophils Absolute: 0 10*3/uL (ref 0.0–0.7)
Lymphocytes Relative: 37.2 % (ref 12.0–46.0)
MCHC: 32.7 g/dL (ref 30.0–36.0)
Neutrophils Relative %: 54.5 % (ref 43.0–77.0)
Platelets: 192 10*3/uL (ref 150.0–400.0)
RDW: 12.2 % (ref 11.5–14.6)

## 2012-11-03 LAB — BASIC METABOLIC PANEL
CO2: 25 mEq/L (ref 19–32)
Calcium: 9.3 mg/dL (ref 8.4–10.5)
Creatinine, Ser: 0.6 mg/dL (ref 0.4–1.2)
Glucose, Bld: 72 mg/dL (ref 70–99)

## 2012-11-04 ENCOUNTER — Encounter: Payer: Self-pay | Admitting: Gastroenterology

## 2012-11-04 LAB — HEPATIC FUNCTION PANEL
Alkaline Phosphatase: 52 U/L (ref 39–117)
Bilirubin, Direct: 0 mg/dL (ref 0.0–0.3)
Total Bilirubin: 0.4 mg/dL (ref 0.3–1.2)

## 2012-11-10 ENCOUNTER — Other Ambulatory Visit (HOSPITAL_COMMUNITY)
Admission: RE | Admit: 2012-11-10 | Discharge: 2012-11-10 | Disposition: A | Discharge status: Home / Self Care | Payer: Managed Care, Other (non HMO) | Source: Ambulatory Visit | Attending: Family Medicine | Admitting: Family Medicine

## 2012-11-10 ENCOUNTER — Ambulatory Visit (INDEPENDENT_AMBULATORY_CARE_PROVIDER_SITE_OTHER): Payer: Managed Care, Other (non HMO) | Admitting: Family Medicine

## 2012-11-10 ENCOUNTER — Encounter: Payer: Self-pay | Admitting: Family Medicine

## 2012-11-10 VITALS — BP 120/80 | Temp 98.4°F | Ht 62.0 in | Wt 135.0 lb

## 2012-11-10 DIAGNOSIS — Z9010 Acquired absence of unspecified breast and nipple: Secondary | ICD-10-CM

## 2012-11-10 DIAGNOSIS — Z01419 Encounter for gynecological examination (general) (routine) without abnormal findings: Secondary | ICD-10-CM

## 2012-11-10 DIAGNOSIS — Z23 Encounter for immunization: Secondary | ICD-10-CM

## 2012-11-10 DIAGNOSIS — D649 Anemia, unspecified: Secondary | ICD-10-CM

## 2012-11-10 DIAGNOSIS — Z9012 Acquired absence of left breast and nipple: Secondary | ICD-10-CM

## 2012-11-10 MED ORDER — ESTROGENS, CONJUGATED 0.625 MG/GM VA CREA: TOPICAL_CREAM | Freq: Every day | VAGINAL | Status: DC

## 2012-11-10 NOTE — Patient Instructions (Signed)
Continue your good health habits  BSE monthly  Followup in 1 year sooner if any problems  Small amounts of the hormonal cream twice weekly

## 2012-11-10 NOTE — Progress Notes (Signed)
  Subjective:    Patient ID: Chelsea Beasley, female    DOB: Oct 01, 1950, 63 y.o.   MRN: [id number]  HPI Chelsea Beasley is a 62 year old female nonsmoker who comes in today for general physical examination  In 2006 she had her left breast removed for breast cancer. She did not have any reconstruction. Right breast is normal. She does meticulous BSE monthly and gets a mammogram on her right breast yearly.  She takes Prilosec daily for reflux   She also takes iron however hemoglobin is unchanged. We've previously checked serum irons B12 all the abdomen normal. He one time we gave her an infusion of iron but that didn't help either.  She gets routine eye care, dental care, checks her right breast monthly, colonoscopy and GI, vaccinations up-to-date tetanus booster today information given on shingles     Review of Systems  Constitutional: Negative.   HENT: Negative.   Eyes: Negative.   Respiratory: Negative.   Cardiovascular: Negative.   Gastrointestinal: Negative.   Genitourinary: Negative.   Musculoskeletal: Negative.   Neurological: Negative.   Psychiatric/Behavioral: Negative.        Objective:   Physical Exam  Constitutional: She appears well-developed and well-nourished.  HENT:  Head: Normocephalic and atraumatic.  Right Ear: External ear normal.  Left Ear: External ear normal.  Nose: Nose normal.  Mouth/Throat: Oropharynx is clear and moist.  Eyes: EOM are normal. Pupils are equal, round, and reactive to light.  Neck: Normal range of motion. Neck supple. No thyromegaly present.  Cardiovascular: Normal rate, regular rhythm, normal heart sounds and intact distal pulses.  Exam reveals no gallop and no friction rub.   No murmur heard. Pulmonary/Chest: Effort normal and breath sounds normal.  Abdominal: Soft. Bowel sounds are normal. She exhibits no distension and no mass. There is no tenderness. There is no rebound.  Genitourinary: Uterus normal. Guaiac negative stool. No vaginal  discharge found.  Marked vaginal dryness  Right breast thorough exam normal left breast surgically removed scar normal  Musculoskeletal: Normal range of motion.  Lymphadenopathy:    She has no cervical adenopathy.  Neurological: She is alert. She has normal reflexes. No cranial nerve deficit. She exhibits normal muscle tone. Coordination normal.  Skin: Skin is warm and dry.  Psychiatric: She has a normal mood and affect. Her behavior is normal. Judgment and thought content normal.          Assessment & Plan:  Healthy female  8 years status post left mastectomy for cancer  Low-grade anemia etiology unknown previous workup was negative  Reflux esophagitis Prilosec 20 mg daily

## 2013-03-20 ENCOUNTER — Ambulatory Visit: Payer: Managed Care, Other (non HMO) | Admitting: *Deleted

## 2013-03-21 ENCOUNTER — Ambulatory Visit (INDEPENDENT_AMBULATORY_CARE_PROVIDER_SITE_OTHER): Payer: Managed Care, Other (non HMO) | Admitting: *Deleted

## 2013-03-21 DIAGNOSIS — Z0000 Encounter for general adult medical examination without abnormal findings: Secondary | ICD-10-CM

## 2013-09-26 ENCOUNTER — Other Ambulatory Visit: Payer: Self-pay

## 2013-09-26 DIAGNOSIS — Z1231 Encounter for screening mammogram for malignant neoplasm of breast: Secondary | ICD-10-CM

## 2013-09-26 DIAGNOSIS — Z9012 Acquired absence of left breast and nipple: Secondary | ICD-10-CM

## 2013-10-19 ENCOUNTER — Ambulatory Visit: Payer: Managed Care, Other (non HMO)

## 2013-10-20 ENCOUNTER — Ambulatory Visit
Admission: RE | Admit: 2013-10-20 | Discharge: 2013-10-20 | Disposition: A | Discharge status: Home / Self Care | Payer: BC Managed Care – PPO | Source: Ambulatory Visit

## 2013-10-20 DIAGNOSIS — Z9012 Acquired absence of left breast and nipple: Secondary | ICD-10-CM

## 2013-10-20 DIAGNOSIS — Z1231 Encounter for screening mammogram for malignant neoplasm of breast: Secondary | ICD-10-CM

## 2013-11-06 ENCOUNTER — Other Ambulatory Visit: Payer: Managed Care, Other (non HMO)

## 2013-11-08 ENCOUNTER — Other Ambulatory Visit (INDEPENDENT_AMBULATORY_CARE_PROVIDER_SITE_OTHER): Payer: BC Managed Care – PPO

## 2013-11-08 DIAGNOSIS — Z0000 Encounter for general adult medical examination without abnormal findings: Secondary | ICD-10-CM

## 2013-11-08 LAB — CBC WITH DIFFERENTIAL/PLATELET
BASOS PCT: 0.5 % (ref 0.0–3.0)
Basophils Absolute: 0 10*3/uL (ref 0.0–0.1)
Eosinophils Absolute: 0.1 10*3/uL (ref 0.0–0.7)
Eosinophils Relative: 1.3 % (ref 0.0–5.0)
HEMATOCRIT: 34.8 % — AB (ref 36.0–46.0)
Hemoglobin: 11.1 g/dL — ABNORMAL LOW (ref 12.0–15.0)
LYMPHS ABS: 2.1 10*3/uL (ref 0.7–4.0)
Lymphocytes Relative: 36.9 % (ref 12.0–46.0)
MCHC: 32 g/dL (ref 30.0–36.0)
MCV: 89.8 fl (ref 78.0–100.0)
MONO ABS: 0.4 10*3/uL (ref 0.1–1.0)
Monocytes Relative: 7.2 % (ref 3.0–12.0)
Neutro Abs: 3.1 10*3/uL (ref 1.4–7.7)
Neutrophils Relative %: 54.1 % (ref 43.0–77.0)
PLATELETS: 213 10*3/uL (ref 150.0–400.0)
RBC: 3.88 Mil/uL (ref 3.87–5.11)
RDW: 12.4 % (ref 11.5–14.6)
WBC: 5.7 10*3/uL (ref 4.5–10.5)

## 2013-11-08 LAB — BASIC METABOLIC PANEL
BUN: 11 mg/dL (ref 6–23)
CHLORIDE: 102 meq/L (ref 96–112)
CO2: 29 meq/L (ref 19–32)
CREATININE: 0.6 mg/dL (ref 0.4–1.2)
Calcium: 9.5 mg/dL (ref 8.4–10.5)
GFR: 121.22 mL/min (ref >60.00)
Glucose, Bld: 81 mg/dL (ref 70–99)
Potassium: 3.6 mEq/L (ref 3.5–5.1)
Sodium: 137 mEq/L (ref 135–145)

## 2013-11-08 LAB — POCT URINALYSIS DIPSTICK
Bilirubin, UA: NEGATIVE
Blood, UA: NEGATIVE
Glucose, UA: NEGATIVE
KETONES UA: NEGATIVE
NITRITE UA: NEGATIVE
PH UA: 5.5
PROTEIN UA: NEGATIVE
Spec Grav, UA: <=1.005
UROBILINOGEN UA: 0.2

## 2013-11-08 LAB — TSH: TSH: 0.56 u[IU]/mL (ref 0.35–5.50)

## 2013-11-08 LAB — HEPATIC FUNCTION PANEL
ALT: 15 U/L (ref 0–35)
AST: 20 U/L (ref 0–37)
Albumin: 3.8 g/dL (ref 3.5–5.2)
Alkaline Phosphatase: 53 U/L (ref 39–117)
Bilirubin, Direct: 0 mg/dL (ref 0.0–0.3)
TOTAL PROTEIN: 7.2 g/dL (ref 6.0–8.3)
Total Bilirubin: 0.7 mg/dL (ref 0.3–1.2)

## 2013-11-08 LAB — LIPID PANEL
Cholesterol: 190 mg/dL (ref 0–200)
HDL: 80.5 mg/dL (ref >39.00)
LDL Cholesterol: 101 mg/dL — ABNORMAL HIGH (ref 0–99)
Total CHOL/HDL Ratio: 2
Triglycerides: 44 mg/dL (ref 0.0–149.0)
VLDL: 8.8 mg/dL (ref 0.0–40.0)

## 2013-11-13 ENCOUNTER — Ambulatory Visit (INDEPENDENT_AMBULATORY_CARE_PROVIDER_SITE_OTHER): Payer: BC Managed Care – PPO | Admitting: Family Medicine

## 2013-11-13 ENCOUNTER — Encounter: Payer: Self-pay | Admitting: Family Medicine

## 2013-11-13 VITALS — BP 120/80 | Temp 98.9°F | Ht 61.0 in | Wt 138.0 lb

## 2013-11-13 DIAGNOSIS — N898 Other specified noninflammatory disorders of vagina: Secondary | ICD-10-CM | POA: Insufficient documentation

## 2013-11-13 DIAGNOSIS — N9489 Other specified conditions associated with female genital organs and menstrual cycle: Secondary | ICD-10-CM

## 2013-11-13 DIAGNOSIS — Z9010 Acquired absence of unspecified breast and nipple: Secondary | ICD-10-CM

## 2013-11-13 DIAGNOSIS — Z01419 Encounter for gynecological examination (general) (routine) without abnormal findings: Secondary | ICD-10-CM

## 2013-11-13 MED ORDER — ESTROGENS, CONJUGATED 0.625 MG/GM VA CREA: TOPICAL_CREAM | Freq: Every day | VAGINAL | Status: DC

## 2013-11-13 NOTE — Progress Notes (Signed)
   Subjective:    Patient ID: Chelsea Beasley, female    DOB: [birth year], 63 y.o.   MRN: [id number]  HPI Chelsea Beasley is a 63 year old female nonsmoker who comes in today for general medical examination  14 years ago she had her left breast removed for cancer. Since that time she's been totally symptom-free. She gets an annual mammogram of her right breast. She did not have any reconstruction  He's become vaginal cream once weekly for vaginal dryness pelvic and Pap Lesher normal therefore not repeated.  She gets routine eye care, dental care, checks her right breast monthly and does get an annual mammogram. She had her first colonoscopy about 10 years ago was normal. She's had a history of low-grade anemia with a hemoglobin now of 11.1 previous studies all been negative she is asymptomatic   Review of Systems  Constitutional: Negative.   HENT: Negative.   Eyes: Negative.   Respiratory: Negative.   Cardiovascular: Negative.   Gastrointestinal: Negative.   Genitourinary: Negative.   Musculoskeletal: Negative.   Neurological: Negative.   Psychiatric/Behavioral: Negative.        Objective:   Physical Exam  Constitutional: She appears well-developed and well-nourished.  HENT:  Head: Normocephalic and atraumatic.  Right Ear: External ear normal.  Left Ear: External ear normal.  Nose: Nose normal.  Mouth/Throat: Oropharynx is clear and moist.  Eyes: EOM are normal. Pupils are equal, round, and reactive to light.  Neck: Normal range of motion. Neck supple. No thyromegaly present.  Cardiovascular: Normal rate, regular rhythm, normal heart sounds and intact distal pulses.  Exam reveals no gallop and no friction rub.   No murmur heard. Pulmonary/Chest: Effort normal and breath sounds normal.  Abdominal: Soft. Bowel sounds are normal. She exhibits no distension and no mass. There is no tenderness. There is no rebound.  Genitourinary:  Scar left anterior chest wall from previous  mastectomy  Right breast normal  Musculoskeletal: Normal range of motion.  Lymphadenopathy:    She has no cervical adenopathy.  Neurological: She is alert. She has normal reflexes. No cranial nerve deficit. She exhibits normal muscle tone. Coordination normal.  Skin: Skin is warm and dry.  Psychiatric: She has a normal mood and affect. Her behavior is normal. Judgment and thought content normal.          Assessment & Plan:  Healthy female  14 years status post left mastectomy for breast cancer  Postmenopausal vaginal dryness Premarin cream weekly  Low-grade anemia etiology unknown

## 2013-11-13 NOTE — Progress Notes (Signed)
Pre visit review using our clinic review tool, if applicable. No additional management support is needed unless otherwise documented below in the visit note. 

## 2013-11-13 NOTE — Patient Instructions (Signed)
Use small amounts of the hormonal cream once or twice weekly  Continue your good health habits  Followup in 1 year sooner if any problems  Dr. Rodena Goldmann

## 2014-03-08 ENCOUNTER — Ambulatory Visit (INDEPENDENT_AMBULATORY_CARE_PROVIDER_SITE_OTHER): Payer: BC Managed Care – PPO | Admitting: Family Medicine

## 2014-03-08 ENCOUNTER — Encounter: Payer: Self-pay | Admitting: Family Medicine

## 2014-03-08 ENCOUNTER — Telehealth: Payer: Self-pay | Admitting: Family Medicine

## 2014-03-08 VITALS — BP 130/80 | Temp 98.7°F | Wt 139.0 lb

## 2014-03-08 DIAGNOSIS — K625 Hemorrhage of anus and rectum: Secondary | ICD-10-CM

## 2014-03-08 MED ORDER — STARCH 51 % RE SUPP: 1.0000 | RECTAL | Status: DC | PRN

## 2014-03-08 NOTE — Telephone Encounter (Signed)
Patient Information:  Caller Name: Chelsea Beasley  Phone: [phone]  Patient: Chelsea Beasley, Chelsea Beasley  Gender: Female  DOB: Oct 29, 1950  Age: 63 Years  PCP: Chelsea Beasley [facility])  Office Follow Up:  Does the office need to follow up with this patient?: Yes  Instructions For The Office: Patient is ED VS Office.  Please contact to schedule for office visit. Care advice and call back parameters reviewed.  RN Note:  Patient is ED VS Office.  Please contact to schedule for office visit. Care advice and call back parameters reviewed.  Symptoms  Reason For Call & Symptoms: Patient states had a BM today at 04:15 a.m.  described as loose red colored.  Described as blood.  [place] water red and blood on tissue. No history of GI /rectal bleeding . Denies any abdominal pain.  Patient does take Fe pills for Anemia.. She is currently at work and denies being dizzy.  Reviewed Health History In EMR: Yes  Reviewed Medications In EMR: Yes  Reviewed Allergies In EMR: Yes  Reviewed Surgeries / Procedures: Yes  Date of Onset of Symptoms: 03/08/2014  Guideline(s) Used:  Rectal Bleeding  Disposition Per Guideline:   Go to ED Now (or to Office with PCP Approval)  Reason For Disposition Reached:   Bloody, black, or tarry bowel movements  Advice Given:  Call Back If:  Bleeding increases in amount  Bleeding occurs 3 or more times after treatment begins  You become worse.  General Instructions for Treating and Preventing Constipation:   Drink adequate liquids.  RN Overrode Recommendation:  Make Appointment  Patient is ED VS Office.  Please contact to schedule for office visit. Care advice and call back parameters reviewed.

## 2014-03-08 NOTE — Telephone Encounter (Signed)
Dr Sherren Mocha was informed of the pts symptoms and stated the Chelsea Beasley needs to be seen and I called the Chelsea Beasley and informed her of this.  An appt was scheduled for today at 4:15pm.

## 2014-03-08 NOTE — Progress Notes (Signed)
   Subjective:    Patient ID: Chelsea Beasley, female    DOB: 1951/04/04, 63 y.o.   MRN: [id number]  HPI Chelsea Beasley is a 63 year old female nonsmoker who comes in today for evaluation of acute bright red rectal bleeding today  She's never had a rectal bleeding in the past. She had a colonoscopy about 7 years ago which was normal. Her bowel habits have been normal. Today she was having normal soft bowel movement and had the sudden onset of painless bright red bleeding. She's not had a recurrent episode since this morning.  A history of trauma   Review of Systems Review of systems otherwise negative    Objective:   Physical Exam Well-developed and nourished female no acute distress vital signs stable she is afebrile examination of the rectum appears normal digital rectal examination shows brown stool guaiac-negative.       Assessment & Plan:  Acute bright rectal bleeding from an internal hemorrhoid,,,,,,, treat symptomatically,,,,,,, GI consult if bleeding persists,

## 2014-03-08 NOTE — Patient Instructions (Signed)
Drink lots of water  Milk of magnesia,,,,,,,,, 2 tablespoons twice daily  Anusol suppositories,,,,,,,,,,,,, 1 nightly x10 nights  If after 10 days the bleeding recurs call your gastroenterologist for further evaluation

## 2014-03-08 NOTE — Progress Notes (Signed)
Pre visit review using our clinic review tool, if applicable. No additional management support is needed unless otherwise documented below in the visit note. 

## 2014-09-18 ENCOUNTER — Encounter: Payer: Self-pay | Admitting: Gastroenterology

## 2014-10-03 ENCOUNTER — Other Ambulatory Visit: Payer: Self-pay

## 2014-10-03 DIAGNOSIS — Z1231 Encounter for screening mammogram for malignant neoplasm of breast: Secondary | ICD-10-CM

## 2014-10-22 ENCOUNTER — Ambulatory Visit
Admission: RE | Admit: 2014-10-22 | Discharge: 2014-10-22 | Disposition: A | Discharge status: Home / Self Care | Payer: BLUE CROSS/BLUE SHIELD | Source: Ambulatory Visit

## 2014-10-22 ENCOUNTER — Ambulatory Visit: Payer: Self-pay

## 2014-10-22 DIAGNOSIS — Z1231 Encounter for screening mammogram for malignant neoplasm of breast: Secondary | ICD-10-CM

## 2014-11-07 ENCOUNTER — Telehealth: Payer: Self-pay | Admitting: Family Medicine

## 2014-11-07 NOTE — Telephone Encounter (Signed)
Pt called to ask for her physical and Dr Sherren Mocha does not have anything. How or who should patient be scheduled.

## 2014-11-07 NOTE — Telephone Encounter (Signed)
lmom for pt to cb

## 2014-11-07 NOTE — Telephone Encounter (Signed)
Please schedule patient.

## 2014-11-08 NOTE — Telephone Encounter (Signed)
Pt has been sch

## 2014-11-21 ENCOUNTER — Other Ambulatory Visit (INDEPENDENT_AMBULATORY_CARE_PROVIDER_SITE_OTHER): Payer: BLUE CROSS/BLUE SHIELD

## 2014-11-21 DIAGNOSIS — Z0000 Encounter for general adult medical examination without abnormal findings: Secondary | ICD-10-CM | POA: Diagnosis not present

## 2014-11-21 LAB — CBC WITH DIFFERENTIAL/PLATELET
Basophils Absolute: 0 10*3/uL (ref 0.0–0.1)
Basophils Relative: 0.5 % (ref 0.0–3.0)
EOS PCT: 1.5 % (ref 0.0–5.0)
Eosinophils Absolute: 0.1 10*3/uL (ref 0.0–0.7)
HEMATOCRIT: 33 % — AB (ref 36.0–46.0)
Hemoglobin: 11 g/dL — ABNORMAL LOW (ref 12.0–15.0)
Lymphocytes Relative: 36.1 % (ref 12.0–46.0)
Lymphs Abs: 2 10*3/uL (ref 0.7–4.0)
MCHC: 33.3 g/dL (ref 30.0–36.0)
MCV: 86.2 fl (ref 78.0–100.0)
Monocytes Absolute: 0.3 10*3/uL (ref 0.1–1.0)
Monocytes Relative: 5 % (ref 3.0–12.0)
NEUTROS PCT: 56.9 % (ref 43.0–77.0)
Neutro Abs: 3.2 10*3/uL (ref 1.4–7.7)
PLATELETS: 190 10*3/uL (ref 150.0–400.0)
RBC: 3.83 Mil/uL — ABNORMAL LOW (ref 3.87–5.11)
RDW: 12.4 % (ref 11.5–15.5)
WBC: 5.5 10*3/uL (ref 4.0–10.5)

## 2014-11-21 LAB — POCT URINALYSIS DIPSTICK
BILIRUBIN UA: NEGATIVE
Blood, UA: NEGATIVE
Glucose, UA: NEGATIVE
KETONES UA: NEGATIVE
NITRITE UA: NEGATIVE
Protein, UA: NEGATIVE
Spec Grav, UA: 1.02
Urobilinogen, UA: 1
pH, UA: 5

## 2014-11-21 LAB — HEPATIC FUNCTION PANEL
ALBUMIN: 4.2 g/dL (ref 3.5–5.2)
ALK PHOS: 57 U/L (ref 39–117)
ALT: 53 U/L — ABNORMAL HIGH (ref 0–35)
AST: 15 U/L (ref 0–37)
Bilirubin, Direct: 0.1 mg/dL (ref 0.0–0.3)
TOTAL PROTEIN: 6.6 g/dL (ref 6.0–8.3)
Total Bilirubin: 0.7 mg/dL (ref 0.2–1.2)

## 2014-11-21 LAB — BASIC METABOLIC PANEL
BUN: 9 mg/dL (ref 6–23)
CALCIUM: 9.5 mg/dL (ref 8.4–10.5)
CO2: 30 mEq/L (ref 19–32)
Chloride: 103 mEq/L (ref 96–112)
Creatinine, Ser: 0.67 mg/dL (ref 0.40–1.20)
GFR: 114.59 mL/min (ref >60.00)
GLUCOSE: 77 mg/dL (ref 70–99)
POTASSIUM: 3.7 meq/L (ref 3.5–5.1)
SODIUM: 137 meq/L (ref 135–145)

## 2014-11-21 LAB — TSH: TSH: 0.98 u[IU]/mL (ref 0.35–4.50)

## 2014-11-21 LAB — LIPID PANEL
CHOL/HDL RATIO: 2
Cholesterol: 172 mg/dL (ref 0–200)
HDL: 76.4 mg/dL (ref >39.00)
LDL CALC: 86 mg/dL (ref 0–99)
NONHDL: 95.6
TRIGLYCERIDES: 47 mg/dL (ref 0.0–149.0)
VLDL: 9.4 mg/dL (ref 0.0–40.0)

## 2014-11-26 ENCOUNTER — Other Ambulatory Visit (HOSPITAL_COMMUNITY)
Admission: RE | Admit: 2014-11-26 | Discharge: 2014-11-26 | Disposition: A | Discharge status: Home / Self Care | Payer: BLUE CROSS/BLUE SHIELD | Source: Ambulatory Visit | Attending: Family Medicine | Admitting: Family Medicine

## 2014-11-26 ENCOUNTER — Encounter: Payer: Self-pay | Admitting: Family Medicine

## 2014-11-26 ENCOUNTER — Ambulatory Visit (INDEPENDENT_AMBULATORY_CARE_PROVIDER_SITE_OTHER): Payer: BLUE CROSS/BLUE SHIELD | Admitting: Family Medicine

## 2014-11-26 ENCOUNTER — Ambulatory Visit (INDEPENDENT_AMBULATORY_CARE_PROVIDER_SITE_OTHER)
Admission: RE | Admit: 2014-11-26 | Discharge: 2014-11-26 | Disposition: A | Discharge status: Home / Self Care | Payer: BLUE CROSS/BLUE SHIELD | Source: Ambulatory Visit | Attending: Family Medicine | Admitting: Family Medicine

## 2014-11-26 VITALS — BP 132/74 | HR 68 | Temp 99.0°F | Ht 60.75 in | Wt 132.0 lb

## 2014-11-26 DIAGNOSIS — Z01419 Encounter for gynecological examination (general) (routine) without abnormal findings: Secondary | ICD-10-CM | POA: Insufficient documentation

## 2014-11-26 DIAGNOSIS — Z0000 Encounter for general adult medical examination without abnormal findings: Secondary | ICD-10-CM

## 2014-11-26 DIAGNOSIS — Z9012 Acquired absence of left breast and nipple: Secondary | ICD-10-CM

## 2014-11-26 DIAGNOSIS — R51 Headache: Secondary | ICD-10-CM | POA: Diagnosis not present

## 2014-11-26 DIAGNOSIS — R519 Headache, unspecified: Secondary | ICD-10-CM

## 2014-11-26 DIAGNOSIS — D6489 Other specified anemias: Secondary | ICD-10-CM

## 2014-11-26 NOTE — Patient Instructions (Addendum)
Go to the main office now for your x-rays of your sinuses and skull,,,,,,,, I will call you the report tomorrow  Rachel's extension is [phone]  [facility]

## 2014-11-26 NOTE — Progress Notes (Signed)
   Subjective:    Patient ID: Chelsea Beasley, female    DOB: [birth year], 64 y.o.   MRN: [id number]  HPI Chelsea Beasley is a 64 year old single female nonsmoker who comes in today for general physical examination  She had her left breast removed many years ago for cancer she's done well no recurrence  She was using Premarin cream couple times a week but she stopped about a year ago.  She's had a history of anemia etiology unknown. Workup and colonoscopy on negative  She says she feels well except she's had a bifrontal headache for 2 weeks. She does not recall any particular triggers. She has no history of allergic rhinitis or trauma. She denies any history of migraine headaches in the past.  She gets routine eye care, dental care, checks her right breast monthly and gets annual mammography of her right breast. Left breast surgically removed.  Vaccinations up-to-date except she's due shingles vaccine   Review of Systems  Constitutional: Negative.   Eyes: Negative.   Respiratory: Negative.   Cardiovascular: Negative.   Gastrointestinal: Negative.   Endocrine: Negative.   Genitourinary: Negative.   Musculoskeletal: Negative.   Skin: Negative.   Allergic/Immunologic: Negative.   Neurological: Positive for headaches.  Hematological: Negative.   Psychiatric/Behavioral: Negative.        Objective:   Physical Exam  Constitutional: She appears well-developed and well-nourished.  HENT:  Head: Normocephalic and atraumatic.  Right Ear: External ear normal.  Left Ear: External ear normal.  Nose: Nose normal.  Mouth/Throat: Oropharynx is clear and moist.  Eyes: EOM are normal. Pupils are equal, round, and reactive to light.  Neck: Normal range of motion. Neck supple. No JVD present. No tracheal deviation present. No thyromegaly present.  Cardiovascular: Normal rate, regular rhythm, normal heart sounds and intact distal pulses.  Exam reveals no gallop and no friction rub.   No murmur  heard. Pulmonary/Chest: Effort normal and breath sounds normal. No stridor. No respiratory distress. She has no wheezes. She has no rales. She exhibits no tenderness.  Abdominal: Soft. Bowel sounds are normal. She exhibits no distension and no mass. There is no tenderness. There is no rebound and no guarding.  Genitourinary: Vagina normal and uterus normal. Guaiac negative stool. No vaginal discharge found.  Right breast normal left breast surgically removed  Musculoskeletal: Normal range of motion.  Lymphadenopathy:    She has no cervical adenopathy.  Neurological: She is alert. She has normal reflexes. No cranial nerve deficit. She exhibits normal muscle tone. Coordination normal.  Skin: Skin is warm and dry. No rash noted. No erythema. No pallor.  Psychiatric: She has a normal mood and affect. Her behavior is normal. Judgment and thought content normal.  Nursing note and vitals reviewed.         Assessment & Plan:  Healthy female  Left mastectomy years ago asymptomatic  Bifrontal headache for 2 weeks x-ray skull and had  History of anemia unknown etiology hemoglobin stable at 11

## 2014-11-26 NOTE — Progress Notes (Signed)
Pre visit review using our clinic review tool, if applicable. No additional management support is needed unless otherwise documented below in the visit note. 

## 2014-11-27 ENCOUNTER — Other Ambulatory Visit: Payer: Self-pay | Admitting: *Deleted

## 2014-11-27 LAB — CYTOLOGY - PAP

## 2014-11-27 MED ORDER — PREDNISONE 20 MG PO TABS: 20.0000 mg | ORAL_TABLET | Freq: Every day | ORAL | Status: DC

## 2015-05-21 ENCOUNTER — Ambulatory Visit (INDEPENDENT_AMBULATORY_CARE_PROVIDER_SITE_OTHER): Payer: BLUE CROSS/BLUE SHIELD | Admitting: Internal Medicine

## 2015-05-21 VITALS — BP 158/76 | HR 62 | Temp 98.7°F | Resp 16 | Ht 61.5 in | Wt 131.0 lb

## 2015-05-21 DIAGNOSIS — S0093XA Contusion of unspecified part of head, initial encounter: Secondary | ICD-10-CM | POA: Diagnosis not present

## 2015-05-21 DIAGNOSIS — G44319 Acute post-traumatic headache, not intractable: Secondary | ICD-10-CM

## 2015-05-21 DIAGNOSIS — IMO0001 Reserved for inherently not codable concepts without codable children: Secondary | ICD-10-CM

## 2015-05-21 DIAGNOSIS — R030 Elevated blood-pressure reading, without diagnosis of hypertension: Secondary | ICD-10-CM | POA: Diagnosis not present

## 2015-05-21 NOTE — Progress Notes (Addendum)
   Subjective:    Patient ID: Chelsea Beasley, female    DOB: 1951-01-24, 64 y.o.   MRN: [id number] This chart was scribed for Chelsea Lin, MD by Zola Button, Medical Scribe. This patient was seen in Room 11 and the patient's care was started at 4:18 PM.   HPI HPI Comments: Chelsea Beasley is a 64 y.o. female who presents to the [facility] complaining of a head injury secondary to a fall yesterday. Patient states she felt off-balance after standing up, then fell and hit the right side of her head on the wheel of someone's wheelchair. She reports having associated right-sided headache, aching right ear pain, and nausea. She has also felt some dimness and fuzziness in her vision. She had some difficultly sleeping last night due to the pain. She has not tried any medications for her pain. Patient denies LOC, memory issues, rhinorrhea and difficulty swallowing. She is not on any blood thinners. Driving without difficulty. Not dizzy. Walks witho ut problems.  Patient Active Problem List   Diagnosis Date Noted  . Frontal headache 11/26/2014  . Bright red rectal bleeding 03/08/2014  . Dryness of vagina 11/13/2013  . Chronic cough 01/20/2012  . CHEST PAIN, ATYPICAL 10/21/2009  . 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 Anemia 09/24/2008  . MASTECTOMY, LEFT, HX OF 09/24/2007     Patient works as a Office manager at [employer], Orthoptist.  Review of Systems  Constitutional: Negative for fever and appetite change.  HENT: Negative for facial swelling and nosebleeds.   Eyes: Negative for visual disturbance.  Cardiovascular: Negative for chest pain.  Neurological: Negative for tremors, weakness and numbness.  Psychiatric/Behavioral: Negative for confusion and agitation.       Objective:   Physical Exam  Constitutional: She is oriented to person, place, and time. She appears well-developed and well-nourished. No distress.  HENT:  Head: Normocephalic.  Right Ear:  Tympanic membrane, external ear and ear canal normal.  Left Ear: Tympanic membrane, external ear and ear canal normal.  Nose: Nose normal.  Mouth/Throat: Oropharynx is clear and moist.  Small ecchymoses to the temporal area, about 1 cm x 2 cm.  Eyes: Conjunctivae and EOM are normal. Pupils are equal, round, and reactive to light.  Neck: Normal range of motion. Neck supple.  Cardiovascular: Normal rate and regular rhythm.   Pulmonary/Chest: Effort normal.  Musculoskeletal: She exhibits no edema.  Neurological: She is alert and oriented to person, place, and time. She has normal reflexes. No cranial nerve deficit. Coordination normal.  Romberg neg Gait wnl Speech wnl  Skin: Skin is warm and dry. No rash noted.  Psychiatric: Her behavior is normal. Judgment and thought content normal.  Apprehensive  Nursing note and vitals reviewed.     Assessment & Plan:  I have completed the patient encounter in its entirety as documented by the scribe, with editing by me where necessary. Eudell Mcphee P. Laney Pastor, M.D.  By signing my name below, I, Zola Button, attest that this documentation has been prepared under the direction and in the presence of Chelsea Lin, MD.  Electronically Signed: Zola Button, Medical Scribe. 05/21/2015. 4:18 PM.   Contusion of head, initial encounter  Elevated blood pressure  Acute post-traumatic headache, not intractable  BP up due to her worries with this close to concussion injury. Advise to remain inactive for next 48-72 hrs until symptoms resolve. To return at once if worse.

## 2015-05-22 ENCOUNTER — Emergency Department (HOSPITAL_COMMUNITY): Payer: BLUE CROSS/BLUE SHIELD

## 2015-05-22 ENCOUNTER — Emergency Department (HOSPITAL_COMMUNITY)
Admission: EM | Admit: 2015-05-22 | Discharge: 2015-05-22 | Disposition: A | Discharge status: Home / Self Care | Payer: BLUE CROSS/BLUE SHIELD | Attending: Emergency Medicine | Admitting: Emergency Medicine

## 2015-05-22 ENCOUNTER — Encounter (HOSPITAL_COMMUNITY): Payer: Self-pay | Admitting: Emergency Medicine

## 2015-05-22 DIAGNOSIS — Z853 Personal history of malignant neoplasm of breast: Secondary | ICD-10-CM | POA: Insufficient documentation

## 2015-05-22 DIAGNOSIS — Y998 Other external cause status: Secondary | ICD-10-CM | POA: Insufficient documentation

## 2015-05-22 DIAGNOSIS — S161XXA Strain of muscle, fascia and tendon at neck level, initial encounter: Secondary | ICD-10-CM | POA: Insufficient documentation

## 2015-05-22 DIAGNOSIS — Z79899 Other long term (current) drug therapy: Secondary | ICD-10-CM | POA: Diagnosis not present

## 2015-05-22 DIAGNOSIS — Y9289 Other specified places as the place of occurrence of the external cause: Secondary | ICD-10-CM | POA: Insufficient documentation

## 2015-05-22 DIAGNOSIS — D649 Anemia, unspecified: Secondary | ICD-10-CM | POA: Diagnosis not present

## 2015-05-22 DIAGNOSIS — Z8669 Personal history of other diseases of the nervous system and sense organs: Secondary | ICD-10-CM | POA: Insufficient documentation

## 2015-05-22 DIAGNOSIS — Y9389 Activity, other specified: Secondary | ICD-10-CM | POA: Insufficient documentation

## 2015-05-22 DIAGNOSIS — W010XXA Fall on same level from slipping, tripping and stumbling without subsequent striking against object, initial encounter: Secondary | ICD-10-CM | POA: Diagnosis not present

## 2015-05-22 DIAGNOSIS — S0990XA Unspecified injury of head, initial encounter: Secondary | ICD-10-CM

## 2015-05-22 DIAGNOSIS — Z7952 Long term (current) use of systemic steroids: Secondary | ICD-10-CM | POA: Diagnosis not present

## 2015-05-22 MED ORDER — HYDROCODONE-ACETAMINOPHEN 5-325 MG PO TABS: 2.0000 | ORAL_TABLET | ORAL | Status: DC | PRN

## 2015-05-22 MED ORDER — MORPHINE SULFATE (PF) 4 MG/ML IV SOLN
4.0000 mg | Freq: Once | INTRAVENOUS | Status: AC
  Administered 2015-05-22: 4 mg via INTRAMUSCULAR
  Filled 2015-05-22: qty 1

## 2015-05-22 NOTE — ED Provider Notes (Signed)
CSN: [id number]     Arrival date & time 05/22/15  1727 History  By signing my name below, I, Jolayne Panther, attest that this documentation has been prepared under the direction and in the presence of Monico Blitz, PA-C Electronically Signed: Jolayne Panther, Scribe. 05/22/2015. 8:09 PM.    Chief Complaint  Patient presents with  . Head Injury     The history is provided by the patient. No language interpreter was used.    HPI Comments: Chelsea Beasley is a 64 y.o. female who presents to the Emergency Department complaining of 9/10 HA following head injury 3 days ago . She reports tripped and fell over a flower bed. Reports hitting head on concrete after head hit a wheelchair; denies LOC. Patient describes fogginess sensation in her head. She reports associated nausea and neck pain. Patient has been able to ambulate but note she feels unbalanced/leaning. She denies vomiting and change in vision. She denies being on blood thinners or any other medications currently and reports NKDA. Patient took Advil yesterday with no alleviation.   Past Medical History  Diagnosis Date  . Carpal tunnel syndrome on right   . Anemia   . Cancer [facility])     breast   Past Surgical History  Procedure Laterality Date  . Appendectomy    . Breast surgery      left, total, 2006, cancer, no reconstruction  . Nose surgery     Family History  Problem Relation Age of Onset  . Hypertension Mother   . Cancer Mother     lung  . Hypertension Sister    Social History  Substance Use Topics  . Smoking status: Never Smoker   . Smokeless tobacco: Never Used  . Alcohol Use: No   OB History    No data available     Review of Systems  A complete 10 system review of systems was obtained and all systems are negative except as noted in the HPI and PMH.   Allergies  Review of patient's allergies indicates no known allergies.  Home Medications   Prior to Admission medications   Medication Sig  Start Date End Date Taking? Authorizing Provider  conjugated estrogens (PREMARIN) vaginal cream Place vaginally daily. Patient not taking: Reported on 05/21/2015 11/13/13   Dorena Cookey, MD  Iron Combinations [facility]) CAPS Take by mouth daily.     Historical Provider, MD  prednisoLONE sodium phosphate (INFLAMASE FORTE) 1 % ophthalmic solution Place 1 drop into both eyes 4 (four) times daily.    Historical Provider, MD  predniSONE (DELTASONE) 20 MG tablet Take 1 tablet (20 mg total) by mouth daily with breakfast. Patient not taking: Reported on 05/21/2015 11/27/14   Dorena Cookey, MD   BP 140/90 mmHg  Pulse 72  Temp(Src) 98.8 F (37.1 C) (Oral)  Resp 18  SpO2 98% Physical Exam  Constitutional: She is oriented to person, place, and time. She appears well-developed and well-nourished. No distress.  HENT:  Head: Normocephalic and atraumatic.  Mouth/Throat: Oropharynx is clear and moist.  Eyes: Conjunctivae and EOM are normal. Pupils are equal, round, and reactive to light.  No TTP of maxillary or frontal sinuses  No TTP or induration of temporal arteries bilaterally  Neck: Normal range of motion. Neck supple.  FROM to C-spine. Pt can touch chin to chest without discomfort. No TTP of midline cervical spine.   Cardiovascular: Normal rate, regular rhythm and intact distal pulses.   Pulmonary/Chest: Effort normal and breath  sounds normal. No respiratory distress. She has no wheezes. She has no rales. She exhibits no tenderness.  Abdominal: Soft. Bowel sounds are normal. She exhibits no distension. There is no tenderness.  Musculoskeletal: Normal range of motion. She exhibits no edema or tenderness.  Neurological: She is alert and oriented to person, place, and time. No cranial nerve deficit.  II-Visual fields grossly intact. III/IV/VI-Extraocular movements intact.  Pupils reactive bilaterally. V/VII-Smile symmetric, equal eyebrow raise,  facial sensation intact VIII- Hearing grossly  intact IX/X-Normal gag XI-bilateral shoulder shrug XII-midline tongue extension Motor: 5/5 bilaterally with normal tone and bulk Cerebellar: Normal finger-to-nose  and normal heel-to-shin test.   Romberg negative Ambulates with a coordinated gait   Skin: Skin is warm and dry.  Psychiatric: She has a normal mood and affect.  Nursing note and vitals reviewed.   ED Course  Procedures  DIAGNOSTIC STUDIES:    Oxygen Saturation is 100% on room air, normal by my interpretation.   COORDINATION OF CARE:  6:25 PM Will order CT scan of head and neck to check for bleeding and order pain medication. Advised pt to follow up PCP. Discussed treatment plan with pt at bedside and pt agreed to plan.   Labs Review Labs Reviewed - No data to display  Imaging Review No results found. I have personally reviewed and evaluated these images as part of my medical decision-making.   EKG Interpretation None      MDM   Final diagnoses:  Head injury, initial encounter  Cervical strain, acute, initial encounter    Filed Vitals:   05/22/15 1756 05/22/15 1935  BP: 134/104 140/90  Pulse: 78 72  Temp: 98.8 F (37.1 C)   TempSrc: Oral   Resp: 18 18  SpO2: 100% 98%    Medications  morphine 4 MG/ML injection 4 mg (4 mg Intramuscular Given 05/22/15 2013)    Chelsea Beasley is 64 y.o. female presenting with difficulty concentrating, headache and cervical pain after patient had a mechanical slip and fall several days ago. Patient is not anticoagulated. Neuro exam is nonfocal. This is likely a concussion. Will obtain CT head and neck. Discussed concussion precautions, mental rest and avoiding secondary impact. Patient states she will follow with her primary care physician.  Case signed out to PA [facility] at shift change. Plan is to follow-up imaging.        Monico Blitz, PA-C 05/23/15 Alta Liu, MD 05/23/15 [phone]

## 2015-05-22 NOTE — Discharge Instructions (Signed)
Take Vicodin as needed for pain. Refer to attached documents for more information. Return to the ED with worsening or concerning symptoms.  °

## 2015-05-22 NOTE — ED Notes (Signed)
Pt states that she tripped over something in her garden on Monday.  States that she has been having nausea and headache since.  States that her neck feels like it is "in knots".  Denies LOC.  States that she has a bump on her head.  Hit her head on concrete.

## 2015-05-22 NOTE — ED Provider Notes (Signed)
8:37 PM Patient signed out to me by Monico Blitz, PA-C. Patient pending CT of head and cervical spine after a mechanical fall with head trauma.   10:13 PM Imaging unremarkable for acute changes. Patient will be discharged with short course of Vicodin for pain relief. No neuro deficits at this time. Patient aware of results and concussion precautions.   Results for orders placed or performed in visit on 11/26/14  PAP [[place]]  Result Value Ref Range   CYTOLOGY - PAP PAP RESULT    Ct Head Wo Contrast  05/22/2015  CLINICAL DATA:  Head injury 3 days ago with headache. Tripped and fell. EXAM: CT HEAD WITHOUT CONTRAST CT CERVICAL SPINE WITHOUT CONTRAST TECHNIQUE: Multidetector CT imaging of the head and cervical spine was performed following the standard protocol without intravenous contrast. Multiplanar CT image reconstructions of the cervical spine were also generated. COMPARISON:  None. FINDINGS: CT HEAD FINDINGS No evidence for acute hemorrhage, mass lesion, midline shift, hydrocephalus or large infarct. Visualized mastoid air cells and paranasal sinuses are clear. No calvarial fracture. CT CERVICAL SPINE FINDINGS No soft tissue swelling in the neck. Negative for fracture or dislocation. Small punctate nodular densities at the right lung apex, largest measuring 3 mm. There is straightening of the cervical spine. Right-sided facet arthropathy at C7-T1. Mild disc space narrowing at C5-C6 and C6-C7. IMPRESSION: No acute intracranial abnormality. Mild degenerative changes in cervical spine without acute bone abnormality. Small punctate nodules at the right lung apex are indeterminate, largest measuring 3 mm. If the patient is at high risk for bronchogenic carcinoma, follow-up chest CT at 1 year is recommended. If the patient is at low risk, no follow-up is needed. This recommendation follows the consensus statement: Guidelines for Management of Small Pulmonary Nodules Detected on CT Scans: A  Statement from the [place] as published in Radiology 2005; 237:395-400. Electronically Signed   By: Markus Daft M.D.   On: 05/22/2015 22:03   Ct Cervical Spine Wo Contrast  05/22/2015  CLINICAL DATA:  Head injury 3 days ago with headache. Tripped and fell. EXAM: CT HEAD WITHOUT CONTRAST CT CERVICAL SPINE WITHOUT CONTRAST TECHNIQUE: Multidetector CT imaging of the head and cervical spine was performed following the standard protocol without intravenous contrast. Multiplanar CT image reconstructions of the cervical spine were also generated. COMPARISON:  None. FINDINGS: CT HEAD FINDINGS No evidence for acute hemorrhage, mass lesion, midline shift, hydrocephalus or large infarct. Visualized mastoid air cells and paranasal sinuses are clear. No calvarial fracture. CT CERVICAL SPINE FINDINGS No soft tissue swelling in the neck. Negative for fracture or dislocation. Small punctate nodular densities at the right lung apex, largest measuring 3 mm. There is straightening of the cervical spine. Right-sided facet arthropathy at C7-T1. Mild disc space narrowing at C5-C6 and C6-C7. IMPRESSION: No acute intracranial abnormality. Mild degenerative changes in cervical spine without acute bone abnormality. Small punctate nodules at the right lung apex are indeterminate, largest measuring 3 mm. If the patient is at high risk for bronchogenic carcinoma, follow-up chest CT at 1 year is recommended. If the patient is at low risk, no follow-up is needed. This recommendation follows the consensus statement: Guidelines for Management of Small Pulmonary Nodules Detected on CT Scans: A Statement from the [place] as published in Radiology 2005; 237:395-400. Electronically Signed   By: Markus Daft M.D.   On: 05/22/2015 22:03      Alvina Chou, PA-C 05/22/15 2214  Forde Dandy, MD 05/23/15 [phone]

## 2015-08-01 ENCOUNTER — Telehealth: Payer: Self-pay | Admitting: Family Medicine

## 2015-08-01 NOTE — Telephone Encounter (Signed)
Left message on machine for patient to call and schedule an appointment

## 2015-08-01 NOTE — Telephone Encounter (Signed)
Patient Name: Chelsea Beasley  DOB: Dec 12, 1950    Initial Comment caller states she has a cough   Nurse Assessment  Nurse: Raphael Gibney, RN, Vanita Ingles Date/Time (Eastern Time): 08/01/2015 2:08:44 PM  Confirm and document reason for call. If symptomatic, describe symptoms. ---Caller states she has a cough for about a week. No nasal congestion. No fever. She has been prescribed cough syrup by the doctor in the past.  Has the patient traveled out of the country within the last 30 days? ---No  Does the patient have any new or worsening symptoms? ---Yes  Will a triage be completed? ---Yes  Related visit to physician within the last 2 weeks? ---No  Does the PT have any chronic conditions? (i.e. diabetes, asthma, etc.) ---No  Is this a behavioral health or substance abuse call? ---No     Guidelines    Guideline Title Affirmed Question Affirmed Notes  Cough - Acute Non-Productive Cough (all triage questions negative)    Final Disposition User   [place], RN, Vanita Ingles    Disagree/Comply: Comply

## 2015-10-25 ENCOUNTER — Telehealth: Payer: Self-pay | Admitting: Family Medicine

## 2015-10-25 NOTE — Telephone Encounter (Signed)
Pt cancel her physical said she don' think she really need one. She did schedule a follow up and would like to have complete labs done before she see the doctor. I am requesting a order please.

## 2015-10-28 NOTE — Telephone Encounter (Signed)
Okay to work in per Dr Todd 

## 2015-10-28 NOTE — Telephone Encounter (Signed)
She don't want a physical she said. She just want to have labs drawn before her fup visit with him in May. Just need a lab order

## 2015-10-29 NOTE — Telephone Encounter (Signed)
Left message on machine for patient to return our call 

## 2015-10-30 NOTE — Telephone Encounter (Signed)
Okay to schedule patient for a 30 minute office visit. Let me know when she is coming and I will put in the lab orders.

## 2015-11-01 LAB — HM MAMMOGRAPHY

## 2015-11-25 ENCOUNTER — Encounter: Payer: Self-pay | Admitting: Family Medicine

## 2015-11-28 NOTE — Telephone Encounter (Signed)
Pt called back because she had not heard anything. Pt wants to know if  it ok for just have labs? She is only scheduled for 15 minutes on Monday and states no reason to come if if has no labs done.  Please advise

## 2015-11-28 NOTE — Telephone Encounter (Signed)
Left message on machine for patient.  Labs will be ordered at her appointment.

## 2015-12-02 ENCOUNTER — Ambulatory Visit: Payer: BLUE CROSS/BLUE SHIELD | Admitting: Family Medicine

## 2015-12-02 NOTE — Telephone Encounter (Signed)
Left message on machine per Dr Sherren Mocha patient should come in for an office visit before labs can be ordered.

## 2015-12-02 NOTE — Telephone Encounter (Signed)
Pt called to cancel her appointment today because she has not done any labs. Pt states she does not want an appointment unless she needs one.  Only wants to do labs. Pt canceled appointment today. Would like to know something before she leaves work today, please call on work number.

## 2015-12-20 ENCOUNTER — Other Ambulatory Visit: Payer: BLUE CROSS/BLUE SHIELD

## 2015-12-30 ENCOUNTER — Encounter: Payer: BLUE CROSS/BLUE SHIELD | Admitting: Family Medicine

## 2016-01-10 ENCOUNTER — Ambulatory Visit (INDEPENDENT_AMBULATORY_CARE_PROVIDER_SITE_OTHER): Payer: BLUE CROSS/BLUE SHIELD | Admitting: Family Medicine

## 2016-01-10 ENCOUNTER — Encounter: Payer: Self-pay | Admitting: Family Medicine

## 2016-01-10 VITALS — BP 100/58 | HR 86 | Temp 98.2°F | Ht 61.5 in | Wt 130.4 lb

## 2016-01-10 DIAGNOSIS — M25511 Pain in right shoulder: Secondary | ICD-10-CM

## 2016-01-10 NOTE — Progress Notes (Signed)
  HPI:  Chelsea Beasley is a pleasant 65 year old here for an acute visit for right shoulder pain: -Started about 2 weeks ago -Cannot think of inciting event, activity or trauma -Pain is sharp, is located in the superior anterior shoulder and only occurs with certain hyperflexion movements of the shoulder -Denies persistent or constant pain, fevers, malaise, radiation of pain, weakness or numbness   ROS: See pertinent positives and negatives per HPI.  Past Medical History  Diagnosis Date  . Carpal tunnel syndrome on right   . Anemia   . Cancer [facility])     breast    Past Surgical History  Procedure Laterality Date  . Appendectomy    . Breast surgery      left, total, 2006, cancer, no reconstruction  . Nose surgery      Family History  Problem Relation Age of Onset  . Hypertension Mother   . Cancer Mother     lung  . Hypertension Sister     Social History   Social History  . Marital Status: Divorced    Spouse Name: N/A  . Number of Children: 2  . Years of Education: N/A   Occupational History  .      audit coordinator   Social History Main Topics  . Smoking status: Never Smoker   . Smokeless tobacco: Never Used  . Alcohol Use: No  . Drug Use: No  . Sexual Activity: Not Asked   Other Topics Concern  . None   Social History Narrative     Current outpatient prescriptions:  .  Iron Combinations (FEOGEN) CAPS, Take by mouth daily. , Disp: , Rfl:   EXAM:  Filed Vitals:   01/10/16 1432  BP: 100/58  Pulse: 86  Temp: 98.2 F (36.8 C)    Body mass index is 24.24 kg/(m^2).  GENERAL: vitals reviewed and listed above, alert, oriented, appears well hydrated and in no acute distress  HEENT: atraumatic, conjunttiva clear, no obvious abnormalities on inspection of external nose and ears  NECK: no obvious masses on inspection  MS: moves all extremities without noticeable abnormality; normal inspection of both shoulders except for she does have a pretty steep  curve to her clavicle; tenderness to palpation in the anterior portion of the  right rotator cuff attachments to the humerus; no tenderness to palpation; today he does not have any pain or symptoms with impingement testing, Neer's, speed's testing, shawl sign test , empty can, or testing of movements of the shoulder against resistance; normal strength and range of motion in the shoulder and arm; neurovascularly intact distally  PSYCH: pleasant and cooperative, no obvious depression or anxiety  ASSESSMENT AND PLAN:  Discussed the following assessment and plan:  Right shoulder pain  -Suspect mild rotator cuff tendinopathy or labral injury (less likely) versus other -Given symptoms are mild, we opted to try home exercises and will have her follow up in 1 month -Patient advised to return or notify a doctor immediately if symptoms worsen or persist or new concerns arise.  Patient Instructions  Before you leave: -Rotator cuff exercises -Schedule a follow-up in 1 month with Dr. Maudie Mercury  Do the exercises provided 4 days per week.  Pain attention to the types of activities that seem to worsen this, or any activities where you are reaching above her head.     Colin Benton R.

## 2016-01-10 NOTE — Patient Instructions (Signed)
Before you leave: -Rotator cuff exercises -Schedule a follow-up in 1 month with Dr. Maudie Mercury  Do the exercises provided 4 days per week.  Pain attention to the types of activities that seem to worsen this, or any activities where you are reaching above her head.

## 2016-01-10 NOTE — Progress Notes (Signed)
Pre visit review using our clinic review tool, if applicable. No additional management support is needed unless otherwise documented below in the visit note. 

## 2016-01-30 ENCOUNTER — Encounter: Payer: Self-pay | Admitting: Adult Health

## 2016-01-30 ENCOUNTER — Ambulatory Visit (INDEPENDENT_AMBULATORY_CARE_PROVIDER_SITE_OTHER): Payer: BLUE CROSS/BLUE SHIELD | Admitting: Adult Health

## 2016-01-30 VITALS — Temp 98.4°F | Ht 61.5 in | Wt 130.4 lb

## 2016-01-30 DIAGNOSIS — J209 Acute bronchitis, unspecified: Secondary | ICD-10-CM | POA: Diagnosis not present

## 2016-01-30 MED ORDER — HYDROCODONE-HOMATROPINE 5-1.5 MG/5ML PO SYRP
5.0000 mL | ORAL_SOLUTION | Freq: Three times a day (TID) | ORAL | Status: DC | PRN
Start: 2016-01-30 — End: 2016-04-27

## 2016-01-30 MED ORDER — PREDNISONE 10 MG PO TABS: ORAL_TABLET | ORAL | Status: DC

## 2016-01-30 NOTE — Patient Instructions (Addendum)
Your exam is consistent with bronchitis.   I have sent in a prescription for prednisone, take as directed  Use the cough syrup at night as it will make you sleepy  During the day you can use Mucinex Cough   Follow up if no improvement   Acute Bronchitis Bronchitis is inflammation of the airways that extend from the windpipe into the lungs (bronchi). The inflammation often causes mucus to develop. This leads to a cough, which is the most common symptom of bronchitis.  In acute bronchitis, the condition usually develops suddenly and goes away over time, usually in a couple weeks. Smoking, allergies, and asthma can make bronchitis worse. Repeated episodes of bronchitis may cause further lung problems.  CAUSES Acute bronchitis is most often caused by the same virus that causes a cold. The virus can spread from person to person (contagious) through coughing, sneezing, and touching contaminated objects. SIGNS AND SYMPTOMS   Cough.   Fever.   Coughing up mucus.   Body aches.   Chest congestion.   Chills.   Shortness of breath.   Sore throat.  DIAGNOSIS  Acute bronchitis is usually diagnosed through a physical exam. Your health care provider will also ask you questions about your medical history. Tests, such as chest X-rays, are sometimes done to rule out other conditions.  TREATMENT  Acute bronchitis usually goes away in a couple weeks. Oftentimes, no medical treatment is necessary. Medicines are sometimes given for relief of fever or cough. Antibiotic medicines are usually not needed but may be prescribed in certain situations. In some cases, an inhaler may be recommended to help reduce shortness of breath and control the cough. A cool mist vaporizer may also be used to help thin bronchial secretions and make it easier to clear the chest.  HOME CARE INSTRUCTIONS  Get plenty of rest.   Drink enough fluids to keep your urine clear or pale yellow (unless you have a medical  condition that requires fluid restriction). Increasing fluids may help thin your respiratory secretions (sputum) and reduce chest congestion, and it will prevent dehydration.   Take medicines only as directed by your health care provider.  If you were prescribed an antibiotic medicine, finish it all even if you start to feel better.  Avoid smoking and secondhand smoke. Exposure to cigarette smoke or irritating chemicals will make bronchitis worse. If you are a smoker, consider using nicotine gum or skin patches to help control withdrawal symptoms. Quitting smoking will help your lungs heal faster.   Reduce the chances of another bout of acute bronchitis by washing your hands frequently, avoiding people with cold symptoms, and trying not to touch your hands to your mouth, nose, or eyes.   Keep all follow-up visits as directed by your health care provider.  SEEK MEDICAL CARE IF: Your symptoms do not improve after 1 week of treatment.  SEEK IMMEDIATE MEDICAL CARE IF:  You develop an increased fever or chills.   You have chest pain.   You have severe shortness of breath.  You have bloody sputum.   You develop dehydration.  You faint or repeatedly feel like you are going to pass out.  You develop repeated vomiting.  You develop a severe headache. MAKE SURE YOU:   Understand these instructions.  Will watch your condition.  Will get help right away if you are not doing well or get worse.   This information is not intended to replace advice given to you by your health care provider.  Make sure you discuss any questions you have with your health care provider.   Document Released: 08/20/2004 Document Revised: 08/03/2014 Document Reviewed: 01/03/2013 Elsevier Interactive Patient Education [employer].

## 2016-01-30 NOTE — Progress Notes (Signed)
Subjective:    Patient ID: Chelsea Beasley, female    DOB: 05-Nov-1950, 64 y.o.   MRN: [id number]  Cough This is a new problem. The current episode started 1 to 4 weeks ago. The problem has been gradually worsening. The problem occurs constantly. The cough is non-productive. Pertinent negatives include no ear congestion, ear pain, fever, nasal congestion, rhinorrhea, shortness of breath or wheezing. Exacerbated by: heat. She has tried nothing for the symptoms. The treatment provided no relief. There is no history of asthma, bronchitis, COPD, environmental allergies or pneumonia.      Review of Systems  Constitutional: Negative for fever.  HENT: Negative for ear pain and rhinorrhea.   Respiratory: Positive for cough. Negative for shortness of breath and wheezing.   Allergic/Immunologic: Negative for environmental allergies.   Past Medical History  Diagnosis Date  . Carpal tunnel syndrome on right   . Anemia   . Cancer [facility])     breast    Social History   Social History  . Marital Status: Divorced    Spouse Name: N/A  . Number of Children: 2  . Years of Education: N/A   Occupational History  .      audit coordinator   Social History Main Topics  . Smoking status: Never Smoker   . Smokeless tobacco: Never Used  . Alcohol Use: No  . Drug Use: No  . Sexual Activity: Not on file   Other Topics Concern  . Not on file   Social History Narrative    Past Surgical History  Procedure Laterality Date  . Appendectomy    . Breast surgery      left, total, 2006, cancer, no reconstruction  . Nose surgery      Family History  Problem Relation Age of Onset  . Hypertension Mother   . Cancer Mother     lung  . Hypertension Sister     No Known Allergies  Current Outpatient Prescriptions on File Prior to Visit  Medication Sig Dispense Refill  . Iron Combinations (FEOGEN) CAPS Take by mouth daily.      No current facility-administered medications on file prior to visit.     Temp(Src) 98.4 F (36.9 C) (Oral)  Ht 5' 1.5" (1.562 m)  Wt 130 lb 6.4 oz (59.149 kg)  BMI 24.24 kg/m2       Objective:   Physical Exam  Constitutional: She is oriented to person, place, and time. She appears well-developed and well-nourished. No distress.  HENT:  Head: Normocephalic and atraumatic.  Right Ear: External ear normal.  Left Ear: External ear normal.  Nose: Nose normal.  Mouth/Throat: Oropharynx is clear and moist. No oropharyngeal exudate.  Eyes: Conjunctivae and EOM are normal. Pupils are equal, round, and reactive to light. Right eye exhibits no discharge. Left eye exhibits no discharge.  Neck: Normal range of motion. Neck supple.  Cardiovascular: Normal rate, regular rhythm, normal heart sounds and intact distal pulses.  Exam reveals no gallop and no friction rub.   No murmur heard. Pulmonary/Chest: Effort normal. No respiratory distress. She has wheezes (trace expiratory wheeze throughout).  Lymphadenopathy:    She has no cervical adenopathy.  Neurological: She is alert and oriented to person, place, and time.  Skin: Skin is warm and dry. No rash noted. She is not diaphoretic. No erythema. No pallor.  Psychiatric: She has a normal mood and affect. Her behavior is normal. Judgment and thought content normal.  Nursing note and vitals reviewed.  Assessment & Plan:  1. Acute bronchitis, unspecified organism - predniSONE (DELTASONE) 10 MG tablet; 40 mg x 3 days, 20 mg x 3 days, 10 mg x 3 days  Dispense: 21 tablet; Refill: 0 - HYDROcodone-homatropine (HYCODAN) 5-1.5 MG/5ML syrup; Take 5 mLs by mouth every 8 (eight) hours as needed for cough.  Dispense: 120 mL; Refill: 0 - Follow up if no improvement  Dorothyann Peng, NP

## 2016-02-07 ENCOUNTER — Encounter: Payer: Self-pay | Admitting: Family Medicine

## 2016-02-07 ENCOUNTER — Ambulatory Visit (INDEPENDENT_AMBULATORY_CARE_PROVIDER_SITE_OTHER): Payer: BLUE CROSS/BLUE SHIELD | Admitting: Family Medicine

## 2016-02-07 VITALS — BP 120/60 | HR 84 | Temp 98.4°F | Ht 61.5 in | Wt 131.4 lb

## 2016-02-07 DIAGNOSIS — M67911 Unspecified disorder of synovium and tendon, right shoulder: Secondary | ICD-10-CM

## 2016-02-07 NOTE — Progress Notes (Signed)
  HPI:  Follow up R shoulder pain: -see note from about 1 month ago for details -reports: Doing so much better, rarely feels mild pain only with certain activities, she is very happy with the response and continues to do the exercises religiously -denies: Weakness, numbness, new symptoms  ROS: See pertinent positives and negatives per HPI.  Past Medical History  Diagnosis Date  . Carpal tunnel syndrome on right   . Anemia   . Cancer [facility])     breast    Past Surgical History  Procedure Laterality Date  . Appendectomy    . Breast surgery      left, total, 2006, cancer, no reconstruction  . Nose surgery      Family History  Problem Relation Age of Onset  . Hypertension Mother   . Cancer Mother     lung  . Hypertension Sister     Social History   Social History  . Marital Status: Divorced    Spouse Name: N/A  . Number of Children: 2  . Years of Education: N/A   Occupational History  .      audit coordinator   Social History Main Topics  . Smoking status: Never Smoker   . Smokeless tobacco: Never Used  . Alcohol Use: No  . Drug Use: No  . Sexual Activity: Not Asked   Other Topics Concern  . None   Social History Narrative     Current outpatient prescriptions:  .  HYDROcodone-homatropine (HYCODAN) 5-1.5 MG/5ML syrup, Take 5 mLs by mouth every 8 (eight) hours as needed for cough., Disp: 120 mL, Rfl: 0 .  Iron Combinations (FEOGEN) CAPS, Take by mouth daily. , Disp: , Rfl:   EXAM:  Filed Vitals:   02/07/16 1429  BP: 120/60  Pulse: 84  Temp: 98.4 F (36.9 C)    Body mass index is 24.43 kg/(m^2).  GENERAL: vitals reviewed and listed above, alert, oriented, appears well hydrated and in no acute distress  HEENT: atraumatic, conjunttiva clear, no obvious abnormalities on inspection of external nose and ears  NECK: no obvious masses on inspection  MS: moves all extremities without noticeable abnormality, normal inspection of the shoulders, no  tenderness to palpation of the shoulder, upper back, or upper arm regions, normal strength and range of motion across the shoulder joint and all movements, negative impingement test, negative empty can  PSYCH: pleasant and cooperative, no obvious depression or anxiety  ASSESSMENT AND PLAN:  Discussed the following assessment and plan:  Tendinopathy of rotator cuff, right  -Wonderful improvement with home exercises and time -Advised to continue the exercises a few days per week and follow-up as needed -Patient advised to return or notify a doctor immediately if symptoms worsen or persist or new concerns arise.  There are no Patient Instructions on file for this visit.  Colin Benton R., DO

## 2016-02-07 NOTE — Progress Notes (Signed)
Pre visit review using our clinic review tool, if applicable. No additional management support is needed unless otherwise documented below in the visit note. 

## 2016-04-27 ENCOUNTER — Encounter: Payer: Self-pay | Admitting: Family Medicine

## 2016-04-27 ENCOUNTER — Ambulatory Visit (INDEPENDENT_AMBULATORY_CARE_PROVIDER_SITE_OTHER): Payer: BLUE CROSS/BLUE SHIELD | Admitting: Family Medicine

## 2016-04-27 VITALS — BP 134/84 | HR 90 | Temp 98.8°F | Wt 128.7 lb

## 2016-04-27 DIAGNOSIS — Z9012 Acquired absence of left breast and nipple: Secondary | ICD-10-CM | POA: Diagnosis not present

## 2016-04-27 NOTE — Patient Instructions (Signed)
With this prescription you should be able to get your special bras and prosthesis on the left.  Set up a time in December on a Monday for your annual checkup

## 2016-04-27 NOTE — Progress Notes (Signed)
Pre visit review using our clinic review tool, if applicable. No additional management support is needed unless otherwise documented below in the visit note. 

## 2016-04-27 NOTE — Progress Notes (Signed)
Chelsea Beasley is a 65 year old female nonsmoker who comes in today to get a prescription for special bras and prosthesis since she's had a left mastectomy many years ago.  We will try to work on for physical in December  She can get a flu shot and shingles vaccine at work.  Physical exam vital signs stable she's afebrile  Problem #1 left mastectomy........ Clinically stable.... Refill prescription for prosthesis and special bras

## 2016-05-10 ENCOUNTER — Emergency Department (HOSPITAL_COMMUNITY): Payer: BLUE CROSS/BLUE SHIELD

## 2016-05-10 ENCOUNTER — Encounter (HOSPITAL_COMMUNITY): Payer: Self-pay | Admitting: *Deleted

## 2016-05-10 ENCOUNTER — Emergency Department (HOSPITAL_COMMUNITY)
Admission: EM | Admit: 2016-05-10 | Discharge: 2016-05-10 | Disposition: A | Discharge status: Home / Self Care | Payer: BLUE CROSS/BLUE SHIELD | Attending: Emergency Medicine | Admitting: Emergency Medicine

## 2016-05-10 DIAGNOSIS — Z853 Personal history of malignant neoplasm of breast: Secondary | ICD-10-CM | POA: Diagnosis not present

## 2016-05-10 DIAGNOSIS — M25561 Pain in right knee: Secondary | ICD-10-CM | POA: Insufficient documentation

## 2016-05-10 MED ORDER — DEXAMETHASONE SODIUM PHOSPHATE 10 MG/ML IJ SOLN
10.0000 mg | Freq: Once | INTRAMUSCULAR | Status: AC
  Administered 2016-05-10: 10 mg via INTRAVENOUS
  Filled 2016-05-10: qty 1

## 2016-05-10 MED ORDER — LIDOCAINE 5 % EX PTCH: 1.0000 | MEDICATED_PATCH | CUTANEOUS | 0 refills | Status: DC

## 2016-05-10 MED ORDER — KETOROLAC TROMETHAMINE 15 MG/ML IJ SOLN
15.0000 mg | Freq: Once | INTRAMUSCULAR | Status: AC
  Administered 2016-05-10: 15 mg via INTRAVENOUS
  Filled 2016-05-10: qty 1

## 2016-05-10 MED ORDER — IBUPROFEN 600 MG PO TABS: 600.0000 mg | ORAL_TABLET | Freq: Three times a day (TID) | ORAL | 0 refills | Status: DC | PRN

## 2016-05-10 NOTE — ED Triage Notes (Signed)
Per EMS pt was sitting at her vanity at home when she suddenly felt her knee pop, c/o excruciating pain' some swelling noted. Pt was given 150 mcg of fentanyl en route and 4 mg of zofran. Pt sts pain is still 10/10

## 2016-05-10 NOTE — ED Notes (Signed)
Pt reports pain still 10/10 and is requesting pain medication, however she sts she doesn't want "anything going inside, just a patch" Explained to pt that as soon as dr evaluates her and order medications we will administer it. Pt's family then expressed their dissatisfaction with the care pt has received. They are demanding to see dr immediately and wants to know why dr wasn't there already. Attempted to explain delay to them, however they weren't receptive.

## 2016-05-10 NOTE — ED Provider Notes (Signed)
[facility] DEPT Provider Note   CSN: [id number] Arrival date & time: 05/10/16  1328     History   Chief Complaint Chief Complaint  Patient presents with  . Knee Pain    HPI Chelsea Beasley is a 65 y.o. female.  The history is provided by the patient. No language interpreter was used.    Chelsea Beasley is a 65 y.o. female who presents to the Emergency Department complaining of knee pain.  She was going to stand up from a bent/leaning position when she felt a sudden pop and severe pain in her right knee. Pain is located in her right anterior knee and laterally. It radiates to her shin and thigh. She received 150 mics of fentanyl prior to ED arrival and continues to complain of ongoing knee pain. No fevers, chest pain, shortness of breath. More than 10 years ago she had pain in her right knee and had a cortisone injection but this is much more severe than that. She has a history of breast cancer, now in remission.  Past Medical History:  Diagnosis Date  . Anemia   . Cancer [facility])    breast  . Carpal tunnel syndrome on right     Patient Active Problem List   Diagnosis Date Noted  . Frontal headache 11/26/2014  . Bright red rectal bleeding 03/08/2014  . Dryness of vagina 11/13/2013  . Chronic cough 01/20/2012  . CHEST PAIN, ATYPICAL 10/21/2009  . 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 Anemia 09/24/2008  . MASTECTOMY, LEFT, HX OF 09/24/2007    Past Surgical History:  Procedure Laterality Date  . APPENDECTOMY    . BREAST SURGERY     left, total, 2006, cancer, no reconstruction  . NOSE SURGERY      OB History    No data available       Home Medications    Prior to Admission medications   Medication Sig Start Date End Date Taking? Authorizing Provider  Iron Combinations (FEOGEN) CAPS Take by mouth daily.     Historical Provider, MD    Family History Family History  Problem Relation Age of Onset  . Hypertension Mother   . Cancer Mother     lung  . Hypertension  Sister     Social History Social History  Substance Use Topics  . Smoking status: Never Smoker  . Smokeless tobacco: Never Used  . Alcohol use No     Allergies   Review of patient's allergies indicates no known allergies.   Review of Systems Review of Systems  All other systems reviewed and are negative.    Physical Exam Updated Vital Signs BP 142/70   Pulse (!) 52   Temp 98.4 F (36.9 C)   Resp 16   SpO2 98%   Physical Exam  Constitutional: She is oriented to person, place, and time. She appears well-developed and well-nourished. No distress.  HENT:  Head: Normocephalic and atraumatic.  Cardiovascular: Normal rate and regular rhythm.   Pulmonary/Chest: Effort normal. No respiratory distress.  Musculoskeletal:  2+ DP pulses bilaterally. There is mild to moderate tenderness throughout the right anterior knee as well as along the lateral joint line. She is unable to range the knee secondary to pain. Sensation to light touch is intact through the distal leg. There is minimal swelling over the right knee. No knee erythema.  Neurological: She is alert and oriented to person, place, and time.  Skin: Skin is warm and dry. Capillary refill takes less than 2  seconds.  Psychiatric: She has a normal mood and affect. Her behavior is normal.  Nursing note and vitals reviewed.    ED Treatments / Results  Labs (all labs ordered are listed, but only abnormal results are displayed) Labs Reviewed - No data to display  EKG  EKG Interpretation None       Radiology Dg Knee Complete 4 Views Right  Result Date: 05/10/2016 CLINICAL DATA:  Anterior knee pain after knee popped and excruciating pain. EXAM: RIGHT KNEE - COMPLETE 4+ VIEW COMPARISON:  None. FINDINGS: Study limited by patient positioning. Within this limitation, no evidence for fracture. No subluxation or dislocation. No evidence of joint effusion. Degenerative spurring noted in all 3 compartments. IMPRESSION:  Degenerative changes without acute findings. Electronically Signed   By: Misty Stanley M.D.   On: 05/10/2016 14:14    Procedures Procedures (including critical care time)  Medications Ordered in ED Medications  ketorolac (TORADOL) 15 MG/ML injection 15 mg (not administered)  dexamethasone (DECADRON) injection 10 mg (not administered)     Initial Impression / Assessment and Plan / ED Course  I have reviewed the triage vital signs and the nursing notes.  Pertinent labs & imaging results that were available during my care of the patient were reviewed by me and considered in my medical decision making (see chart for details).  Clinical Course    Patient here for evaluation of acute knee pain following straightening her leg. She has minimal swelling on examination. Presentation is not consistent with acute fracture/dislocation. Placed in knee immobilizer with Tylenol/ibuprofen at home. She was given a one-time dose steroids for inflammation. Discussed outpatient follow-up and return precautions.  Final Clinical Impressions(s) / ED Diagnoses   Final diagnoses:  Acute pain of right knee    New Prescriptions New Prescriptions   No medications on file     Quintella Reichert, MD 05/10/16 1844

## 2016-05-13 ENCOUNTER — Telehealth (HOSPITAL_BASED_OUTPATIENT_CLINIC_OR_DEPARTMENT_OTHER): Payer: Self-pay | Admitting: Emergency Medicine

## 2016-06-22 ENCOUNTER — Other Ambulatory Visit (INDEPENDENT_AMBULATORY_CARE_PROVIDER_SITE_OTHER): Payer: BLUE CROSS/BLUE SHIELD

## 2016-06-22 DIAGNOSIS — Z0000 Encounter for general adult medical examination without abnormal findings: Secondary | ICD-10-CM | POA: Diagnosis not present

## 2016-06-22 LAB — BASIC METABOLIC PANEL
BUN: 12 mg/dL (ref 6–23)
CO2: 29 mEq/L (ref 19–32)
Calcium: 9.4 mg/dL (ref 8.4–10.5)
Chloride: 103 mEq/L (ref 96–112)
Creatinine, Ser: 0.64 mg/dL (ref 0.40–1.20)
GFR: 120.19 mL/min (ref >60.00)
Glucose, Bld: 76 mg/dL (ref 70–99)
Potassium: 4.1 mEq/L (ref 3.5–5.1)
Sodium: 139 mEq/L (ref 135–145)

## 2016-06-22 LAB — CBC WITH DIFFERENTIAL/PLATELET
Basophils Absolute: 0 10*3/uL (ref 0.0–0.1)
Basophils Relative: 0.3 % (ref 0.0–3.0)
Eosinophils Absolute: 0.1 10*3/uL (ref 0.0–0.7)
Eosinophils Relative: 1 % (ref 0.0–5.0)
HCT: 35.3 % — ABNORMAL LOW (ref 36.0–46.0)
Hemoglobin: 11.5 g/dL — ABNORMAL LOW (ref 12.0–15.0)
Lymphocytes Relative: 26.4 % (ref 12.0–46.0)
Lymphs Abs: 1.7 10*3/uL (ref 0.7–4.0)
MCHC: 32.6 g/dL (ref 30.0–36.0)
MCV: 87.3 fl (ref 78.0–100.0)
Monocytes Absolute: 0.5 10*3/uL (ref 0.1–1.0)
Monocytes Relative: 7.5 % (ref 3.0–12.0)
Neutro Abs: 4.1 10*3/uL (ref 1.4–7.7)
Neutrophils Relative %: 64.8 % (ref 43.0–77.0)
Platelets: 208 10*3/uL (ref 150.0–400.0)
RBC: 4.04 Mil/uL (ref 3.87–5.11)
RDW: 13.1 % (ref 11.5–15.5)
WBC: 6.4 10*3/uL (ref 4.0–10.5)

## 2016-06-22 LAB — LIPID PANEL
CHOLESTEROL: 187 mg/dL (ref 0–200)
HDL: 91.5 mg/dL (ref >39.00)
LDL Cholesterol: 88 mg/dL (ref 0–99)
NonHDL: 95.3
TRIGLYCERIDES: 38 mg/dL (ref 0.0–149.0)
Total CHOL/HDL Ratio: 2
VLDL: 7.6 mg/dL (ref 0.0–40.0)

## 2016-06-22 LAB — POC URINALSYSI DIPSTICK (AUTOMATED)
Bilirubin, UA: NEGATIVE
Blood, UA: NEGATIVE
GLUCOSE UA: NEGATIVE
Ketones, UA: NEGATIVE
LEUKOCYTES UA: NEGATIVE
NITRITE UA: NEGATIVE
PROTEIN UA: NEGATIVE
Spec Grav, UA: 1.02
UROBILINOGEN UA: 0.2
pH, UA: 5

## 2016-06-22 LAB — HEPATIC FUNCTION PANEL
ALT: 14 U/L (ref 0–35)
AST: 17 U/L (ref 0–37)
Albumin: 4.2 g/dL (ref 3.5–5.2)
Alkaline Phosphatase: 51 U/L (ref 39–117)
Bilirubin, Direct: 0.1 mg/dL (ref 0.0–0.3)
Total Bilirubin: 0.4 mg/dL (ref 0.2–1.2)
Total Protein: 7.2 g/dL (ref 6.0–8.3)

## 2016-06-22 LAB — TSH: TSH: 1.48 u[IU]/mL (ref 0.35–4.50)

## 2016-06-24 ENCOUNTER — Other Ambulatory Visit: Payer: BLUE CROSS/BLUE SHIELD

## 2016-06-29 ENCOUNTER — Encounter: Payer: Self-pay | Admitting: Family Medicine

## 2016-06-29 ENCOUNTER — Ambulatory Visit (INDEPENDENT_AMBULATORY_CARE_PROVIDER_SITE_OTHER): Payer: BLUE CROSS/BLUE SHIELD | Admitting: Family Medicine

## 2016-06-29 VITALS — BP 154/80 | HR 75 | Temp 98.0°F | Ht 60.75 in | Wt 131.8 lb

## 2016-06-29 DIAGNOSIS — Z0000 Encounter for general adult medical examination without abnormal findings: Secondary | ICD-10-CM | POA: Diagnosis not present

## 2016-06-29 DIAGNOSIS — Z9012 Acquired absence of left breast and nipple: Secondary | ICD-10-CM

## 2016-06-29 NOTE — Progress Notes (Signed)
Pre visit review using our clinic review tool, if applicable. No additional management support is needed unless otherwise documented below in the visit note. 

## 2016-06-29 NOTE — Patient Instructions (Signed)
Continue good health habits  Return in one year for general physical exam sooner if any problems  Bone density........... it may take a month for [place] to get a report after you have it done. If you don't hear from [place] in a month after your bone density then contact us on my chart

## 2016-06-29 NOTE — Progress Notes (Signed)
65 year old married female nonsmoker who comes in today for general physical examination  She's always had a history of low hemoglobin. She's been evaluated. She currently takes one iron tablet daily. Her last menstrual period was 11 years ago. She is postmenopausal and asymptomatic  In 2006 she was diagnosed of breast cancer. Her left breast removed did not have a reconstructive procedure. She gets mammogram in her right breast yearly.  Colonoscopy 2009 normal  She gets routine eye care, dental care, BSE monthly,  You systems negative  Family history positive for hypertension father and sister.  Vaccinations tetanus 2014 flu shot at work she'll call insurance coverage about getting a shingles vaccine.  She takes no medications except for an iron tablet daily.  Physical evaluation......................BP (!) 154/80 (BP Location: Right Arm, Patient Position: Sitting, Cuff Size: Normal)   Pulse 75   Temp 98 F (36.7 C) (Oral)   Ht 5' 0.75" (1.543 m)   Wt 131 lb 12.8 oz (59.8 kg)   LMP 01/27/2005 (Approximate)   SpO2 99%   BMI 25.11 kg/m  Indication of the HEENT were negative neck was supple no adenopathy thyroid normal no carotid bruits cardiopulmonary exam normal left breast surgically removed right breast normal. Abdominal exam negative. Pelvic done 2016 normal therefore not repeated. Extremities normal skin normal peripheral pulses normal  #1 healthy female #2  Chronic low-grade anemia etiology unknown.....Marland Kitchen continue iron therapy  #...4........11 years status post left mastectomy for breast cancer........ continue screening right breast yearly

## 2016-07-03 ENCOUNTER — Ambulatory Visit (INDEPENDENT_AMBULATORY_CARE_PROVIDER_SITE_OTHER)
Admission: RE | Admit: 2016-07-03 | Discharge: 2016-07-03 | Disposition: A | Discharge status: Home / Self Care | Payer: BLUE CROSS/BLUE SHIELD | Source: Ambulatory Visit | Attending: Family Medicine | Admitting: Family Medicine

## 2016-07-03 DIAGNOSIS — Z0000 Encounter for general adult medical examination without abnormal findings: Secondary | ICD-10-CM | POA: Diagnosis not present

## 2016-07-06 DIAGNOSIS — Z0000 Encounter for general adult medical examination without abnormal findings: Secondary | ICD-10-CM | POA: Diagnosis not present

## 2016-12-02 ENCOUNTER — Other Ambulatory Visit: Payer: Self-pay | Admitting: Family Medicine

## 2016-12-02 DIAGNOSIS — Z9012 Acquired absence of left breast and nipple: Secondary | ICD-10-CM

## 2016-12-02 DIAGNOSIS — Z853 Personal history of malignant neoplasm of breast: Secondary | ICD-10-CM

## 2016-12-24 ENCOUNTER — Ambulatory Visit
Admission: RE | Admit: 2016-12-24 | Discharge: 2016-12-24 | Disposition: A | Discharge status: Home / Self Care | Payer: BLUE CROSS/BLUE SHIELD | Source: Ambulatory Visit | Attending: Family Medicine | Admitting: Family Medicine

## 2016-12-24 DIAGNOSIS — Z853 Personal history of malignant neoplasm of breast: Secondary | ICD-10-CM

## 2016-12-24 DIAGNOSIS — Z9012 Acquired absence of left breast and nipple: Secondary | ICD-10-CM

## 2017-04-16 ENCOUNTER — Encounter: Payer: Self-pay | Admitting: Family Medicine

## 2017-11-01 ENCOUNTER — Encounter: Payer: BLUE CROSS/BLUE SHIELD | Admitting: Family Medicine

## 2017-11-01 DIAGNOSIS — Z0289 Encounter for other administrative examinations: Secondary | ICD-10-CM

## 2017-12-06 ENCOUNTER — Other Ambulatory Visit: Payer: Self-pay | Admitting: Family Medicine

## 2017-12-06 DIAGNOSIS — Z1231 Encounter for screening mammogram for malignant neoplasm of breast: Secondary | ICD-10-CM

## 2017-12-14 ENCOUNTER — Encounter: Payer: BLUE CROSS/BLUE SHIELD | Admitting: Family Medicine

## 2017-12-23 ENCOUNTER — Other Ambulatory Visit (HOSPITAL_COMMUNITY)
Admission: RE | Admit: 2017-12-23 | Discharge: 2017-12-23 | Disposition: A | Discharge status: Home / Self Care | Payer: BLUE CROSS/BLUE SHIELD | Source: Ambulatory Visit | Attending: Family Medicine | Admitting: Family Medicine

## 2017-12-23 ENCOUNTER — Encounter: Payer: Self-pay | Admitting: Family Medicine

## 2017-12-23 ENCOUNTER — Ambulatory Visit (INDEPENDENT_AMBULATORY_CARE_PROVIDER_SITE_OTHER): Payer: BLUE CROSS/BLUE SHIELD | Admitting: Family Medicine

## 2017-12-23 VITALS — BP 140/78 | HR 68 | Temp 98.6°F | Ht 60.0 in | Wt 132.0 lb

## 2017-12-23 DIAGNOSIS — R079 Chest pain, unspecified: Secondary | ICD-10-CM

## 2017-12-23 DIAGNOSIS — Z124 Encounter for screening for malignant neoplasm of cervix: Secondary | ICD-10-CM | POA: Diagnosis not present

## 2017-12-23 DIAGNOSIS — Z0000 Encounter for general adult medical examination without abnormal findings: Secondary | ICD-10-CM

## 2017-12-23 DIAGNOSIS — M722 Plantar fascial fibromatosis: Secondary | ICD-10-CM

## 2017-12-23 DIAGNOSIS — Z23 Encounter for immunization: Secondary | ICD-10-CM

## 2017-12-23 DIAGNOSIS — Z9012 Acquired absence of left breast and nipple: Secondary | ICD-10-CM | POA: Diagnosis not present

## 2017-12-23 LAB — HEPATIC FUNCTION PANEL
ALK PHOS: 51 U/L (ref 39–117)
ALT: 13 U/L (ref 0–35)
AST: 15 U/L (ref 0–37)
Albumin: 4.3 g/dL (ref 3.5–5.2)
BILIRUBIN DIRECT: 0.1 mg/dL (ref 0.0–0.3)
BILIRUBIN TOTAL: 0.6 mg/dL (ref 0.2–1.2)
Total Protein: 7.2 g/dL (ref 6.0–8.3)

## 2017-12-23 LAB — CBC WITH DIFFERENTIAL/PLATELET
BASOS PCT: 0.3 % (ref 0.0–3.0)
Basophils Absolute: 0 10*3/uL (ref 0.0–0.1)
EOS PCT: 0.4 % (ref 0.0–5.0)
Eosinophils Absolute: 0 10*3/uL (ref 0.0–0.7)
HCT: 36.5 % (ref 36.0–46.0)
HEMOGLOBIN: 11.8 g/dL — AB (ref 12.0–15.0)
Lymphocytes Relative: 30.9 % (ref 12.0–46.0)
Lymphs Abs: 2 10*3/uL (ref 0.7–4.0)
MCHC: 32.4 g/dL (ref 30.0–36.0)
MCV: 89.5 fl (ref 78.0–100.0)
MONO ABS: 0.4 10*3/uL (ref 0.1–1.0)
Monocytes Relative: 6.4 % (ref 3.0–12.0)
Neutro Abs: 4 10*3/uL (ref 1.4–7.7)
Neutrophils Relative %: 62 % (ref 43.0–77.0)
Platelets: 252 10*3/uL (ref 150.0–400.0)
RBC: 4.08 Mil/uL (ref 3.87–5.11)
RDW: 12.3 % (ref 11.5–15.5)
WBC: 6.4 10*3/uL (ref 4.0–10.5)

## 2017-12-23 LAB — POCT URINALYSIS DIPSTICK
BILIRUBIN UA: NEGATIVE
Blood, UA: NEGATIVE
Glucose, UA: NEGATIVE
Ketones, UA: NEGATIVE
Leukocytes, UA: NEGATIVE
Nitrite, UA: NEGATIVE
Protein, UA: NEGATIVE
Spec Grav, UA: >=1.03 — AB (ref 1.010–1.025)
Urobilinogen, UA: 0.2 E.U./dL
pH, UA: 6 (ref 5.0–8.0)

## 2017-12-23 LAB — BASIC METABOLIC PANEL
BUN: 13 mg/dL (ref 6–23)
CO2: 29 meq/L (ref 19–32)
Calcium: 9.9 mg/dL (ref 8.4–10.5)
Chloride: 101 mEq/L (ref 96–112)
Creatinine, Ser: 0.67 mg/dL (ref 0.40–1.20)
GFR: 113.47 mL/min (ref >60.00)
GLUCOSE: 84 mg/dL (ref 70–99)
POTASSIUM: 4.2 meq/L (ref 3.5–5.1)
SODIUM: 138 meq/L (ref 135–145)

## 2017-12-23 LAB — LIPID PANEL
Cholesterol: 205 mg/dL — ABNORMAL HIGH (ref 0–200)
HDL: 101.5 mg/dL (ref >39.00)
LDL Cholesterol: 93 mg/dL (ref 0–99)
NONHDL: 103.33
Total CHOL/HDL Ratio: 2
Triglycerides: 50 mg/dL (ref 0.0–149.0)
VLDL: 10 mg/dL (ref 0.0–40.0)

## 2017-12-23 LAB — TSH: TSH: 1.12 u[IU]/mL (ref 0.35–4.50)

## 2017-12-23 NOTE — Progress Notes (Signed)
Chelsea Beasley is a 67 year old single female nonsmoker who comes in today for annual physical examination because of a history of breast cancer  She's always been in excellent health she said no chronic health problems except for the breast cancer. This was diagnosed in 2006. She subsequently had a left mastectomy with reconstruction. She's done well and has had no complications or recurrence on the right side. She's due for follow-up mammogram June 2.  2 weeks ago she began having pain on the left side of her chest wall. She denies any history of trauma. She says it comes and goes. She's concerned because its next to the scar where she had her mastectomy in 2006.  She gets routine eye care, dental care, BSE monthly on her right breast, and you mammography, colonoscopy 2009 was normal. She says she never got a recall card. We'll send a note to GI  She's had pain in her left heel for the past year she's had 3 injections etc. etc. nothing has seemed to help.  Vaccinations up-to-date information given on the shingles vaccine. Pneumovax 13 given today  Uterus and ovaries are intact. Last Pap smear was 2016. We'll repeat her Pap smear today.  14 point review of systems otherwise negative  BP 140/78 (BP Location: Right Arm, Patient Position: Sitting, Cuff Size: Normal)   Pulse 68   Temp 98.6 F (37 C) (Oral)   Ht 5' (1.524 m)   Wt 132 lb (59.9 kg)   LMP 01/27/2005 (Approximate)   BMI 25.78 kg/m  Well-developed well-nourished female no acute distress vital signs stable she's afebrile HEENT was negative neck was supple thyroid is not large no carotid bruits. Cardiopulmonary exam normal. Right breast normal. Left breast surgical removed. Scarred normal no palpable tenderness. There she points to is that the edge of the incision in her left axillary area.  Abdominal exam negative  Pelvic examination external genitalia within normal limits vaginal vault was normal except for some hypopigmentation around  the rectum. Pap smear was done. Bimanual exam negative rectum normal. Guaiac negative extremities normal skin normal peripheral pulses normal.    #1 healthy female  #2 status post left mastectomy 2006..... Now with chest pain at the margin the incision...Marland KitchenMarland KitchenMarland Kitchen will begin workup with chest x-ray to rule out metastatic disease  #3 plantar fasciitis left heel......... PT consult

## 2017-12-23 NOTE — Patient Instructions (Signed)
Labs today......... I will call you if is anything abnormal  Go to the main office for your chest x-ray  Call GI and not your colonoscopy.......... [phone]  Call your insurance company about the new shingles vaccine  I'll put in a consult note for physical therapy about your left heel plantar fasciitis.

## 2017-12-24 LAB — CYTOLOGY - PAP

## 2017-12-27 ENCOUNTER — Ambulatory Visit
Admission: RE | Admit: 2017-12-27 | Discharge: 2017-12-27 | Disposition: A | Discharge status: Home / Self Care | Payer: BLUE CROSS/BLUE SHIELD | Source: Ambulatory Visit | Attending: Family Medicine | Admitting: Family Medicine

## 2017-12-27 ENCOUNTER — Encounter: Payer: Self-pay | Admitting: Gastroenterology

## 2017-12-27 DIAGNOSIS — Z1231 Encounter for screening mammogram for malignant neoplasm of breast: Secondary | ICD-10-CM

## 2018-01-03 ENCOUNTER — Ambulatory Visit (INDEPENDENT_AMBULATORY_CARE_PROVIDER_SITE_OTHER)
Admission: RE | Admit: 2018-01-03 | Discharge: 2018-01-03 | Disposition: A | Discharge status: Home / Self Care | Payer: BLUE CROSS/BLUE SHIELD | Source: Ambulatory Visit | Attending: Family Medicine | Admitting: Family Medicine

## 2018-01-03 DIAGNOSIS — R079 Chest pain, unspecified: Secondary | ICD-10-CM

## 2018-03-07 ENCOUNTER — Ambulatory Visit (AMBULATORY_SURGERY_CENTER): Payer: Self-pay

## 2018-03-07 VITALS — Ht 60.0 in | Wt 133.0 lb

## 2018-03-07 DIAGNOSIS — Z1211 Encounter for screening for malignant neoplasm of colon: Secondary | ICD-10-CM

## 2018-03-07 MED ORDER — NA SULFATE-K SULFATE-MG SULF 17.5-3.13-1.6 GM/177ML PO SOLN: 1.0000 | Freq: Once | ORAL | 0 refills | Status: AC

## 2018-03-07 NOTE — Progress Notes (Signed)
Denies allergies to eggs or soy products. Denies complication of anesthesia or sedation. Denies use of weight loss medication. Denies use of O2.   Emmi instructions declined.  

## 2018-03-11 ENCOUNTER — Encounter: Payer: Self-pay | Admitting: Gastroenterology

## 2018-03-21 ENCOUNTER — Encounter: Payer: Self-pay | Admitting: Gastroenterology

## 2018-03-21 ENCOUNTER — Ambulatory Visit (AMBULATORY_SURGERY_CENTER): Payer: BLUE CROSS/BLUE SHIELD | Admitting: Gastroenterology

## 2018-03-21 VITALS — BP 156/72 | HR 56 | Temp 98.9°F | Resp 10 | Ht 60.0 in | Wt 132.0 lb

## 2018-03-21 DIAGNOSIS — D123 Benign neoplasm of transverse colon: Secondary | ICD-10-CM

## 2018-03-21 DIAGNOSIS — Z86010 Personal history of colonic polyps: Secondary | ICD-10-CM | POA: Diagnosis not present

## 2018-03-21 MED ORDER — SODIUM CHLORIDE 0.9 % IV SOLN: 500.0000 mL | Freq: Once | INTRAVENOUS | Status: DC

## 2018-03-21 NOTE — Op Note (Signed)
[place] Patient Name: Chelsea Beasley Procedure Date: 03/21/2018 9:48 AM MRN: [id number] Endoscopist: Ladene Artist , MD Age: 67 Referring MD:  Date of Birth: [birth year] Gender: Female Account #: [id number] Procedure:                Colonoscopy Indications:              Surveillance: Personal history of adenomatous                            polyps on last colonoscopy > 5 years ago Medicines:                Monitored Anesthesia Care Procedure:                Pre-Anesthesia Assessment:                           - Prior to the procedure, a History and Physical                            was performed, and patient medications and                            allergies were reviewed. The patient's tolerance of                            previous anesthesia was also reviewed. The risks                            and benefits of the procedure and the sedation                            options and risks were discussed with the patient.                            All questions were answered, and informed consent                            was obtained. Prior Anticoagulants: The patient has                            taken no previous anticoagulant or antiplatelet                            agents. ASA Grade Assessment: II - A patient with                            mild systemic disease. After reviewing the risks                            and benefits, the patient was deemed in                            satisfactory condition to undergo the procedure.  After obtaining informed consent, the colonoscope                            was passed under direct vision. Throughout the                            procedure, the patient's blood pressure, pulse, and                            oxygen saturations were monitored continuously. The                            Colonoscope was introduced through the anus and                            advanced to the the  cecum, identified by                            appendiceal orifice and ileocecal valve. The                            ileocecal valve, appendiceal orifice, and rectum                            were photographed. The quality of the bowel                            preparation was good. The colonoscopy was performed                            without difficulty. The patient tolerated the                            procedure well. Scope In: 9:54:51 AM Scope Out: 10:09:32 AM Scope Withdrawal Time: 0 hours 10 minutes 40 seconds  Total Procedure Duration: 0 hours 14 minutes 41 seconds  Findings:                 The perianal and digital rectal examinations were                            normal.                           A 7 mm polyp was found in the transverse colon. The                            polyp was sessile. The polyp was removed with a                            cold snare. Resection and retrieval were complete.                           Internal hemorrhoids were found during  retroflexion. The hemorrhoids were medium-sized and                            Grade I (internal hemorrhoids that do not prolapse).                           The exam was otherwise without abnormality on                            direct and retroflexion views. Complications:            No immediate complications. Estimated blood loss:                            None. Estimated Blood Loss:     Estimated blood loss: none. Impression:               - One 7 mm polyp in the transverse colon, removed                            with a cold snare. Resected and retrieved.                           - Internal hemorrhoids.                           - The examination was otherwise normal on direct                            and retroflexion views. Recommendation:           - Repeat colonoscopy in 5 years for surveillance.                           - Patient has a contact number available for                             emergencies. The signs and symptoms of potential                            delayed complications were discussed with the                            patient. Return to normal activities tomorrow.                            Written discharge instructions were provided to the                            patient.                           - Resume previous diet.                           - Continue present medications.                           -  Await pathology results. Ladene Artist, MD 03/21/2018 10:12:27 AM This report has been signed electronically.

## 2018-03-21 NOTE — Progress Notes (Signed)
Pt's states no medical or surgical changes since previsit or office visit. 

## 2018-03-21 NOTE — Progress Notes (Signed)
Report to PACU, RN, vss, BBS= Clear.  

## 2018-03-21 NOTE — Patient Instructions (Signed)
Impression/Recommendations:  Polyp handout given to patient. Hemorrhoid handout given to patient.  Repeat colonoscopy in 5 years for surveillance.  Resume previous diet. Continue present medications.  Await pathology results.  YOU HAD AN ENDOSCOPIC PROCEDURE TODAY AT [facility]:   Refer to the procedure report that was given to you for any specific questions about what was found during the examination.  If the procedure report does not answer your questions, please call your gastroenterologist to clarify.  If you requested that your care partner not be given the details of your procedure findings, then the procedure report has been included in a sealed envelope for you to review at your convenience later.  YOU SHOULD EXPECT: Some feelings of bloating in the abdomen. Passage of more gas than usual.  Walking can help get rid of the air that was put into your GI tract during the procedure and reduce the bloating. If you had a lower endoscopy (such as a colonoscopy or flexible sigmoidoscopy) you may notice spotting of blood in your stool or on the toilet paper. If you underwent a bowel prep for your procedure, you may not have a normal bowel movement for a few days.  Please Note:  You might notice some irritation and congestion in your nose or some drainage.  This is from the oxygen used during your procedure.  There is no need for concern and it should clear up in a day or so.  SYMPTOMS TO REPORT IMMEDIATELY:   Following lower endoscopy (colonoscopy or flexible sigmoidoscopy):  Excessive amounts of blood in the stool  Significant tenderness or worsening of abdominal pains  Swelling of the abdomen that is new, acute  Fever of 100F or higher  For urgent or emergent issues, a gastroenterologist can be reached at any hour by calling [phone].   DIET:  We do recommend a small meal at first, but then you may proceed to your regular diet.  Drink plenty of fluids but you  should avoid alcoholic beverages for 24 hours.  ACTIVITY:  You should plan to take it easy for the rest of today and you should NOT DRIVE or use heavy machinery until tomorrow (because of the sedation medicines used during the test).    FOLLOW UP: Our staff will call the number listed on your records the next business day following your procedure to check on you and address any questions or concerns that you may have regarding the information given to you following your procedure. If we do not reach you, we will leave a message.  However, if you are feeling well and you are not experiencing any problems, there is no need to return our call.  We will assume that you have returned to your regular daily activities without incident.  If any biopsies were taken you will be contacted by phone or by letter within the next 1-3 weeks.  Please call us at [phone] if you have not heard about the biopsies in 3 weeks.    SIGNATURES/CONFIDENTIALITY: You and/or your care partner have signed paperwork which will be entered into your electronic medical record.  These signatures attest to the fact that that the information above on your After Visit Summary has been reviewed and is understood.  Full responsibility of the confidentiality of this discharge information lies with you and/or your care-partner.

## 2018-03-21 NOTE — Progress Notes (Signed)
Called to room to assist during endoscopic procedure.  Patient ID and intended procedure confirmed with present staff. Received instructions for my participation in the procedure from the performing physician.  

## 2018-03-22 ENCOUNTER — Telehealth: Payer: Self-pay

## 2018-03-22 NOTE — Telephone Encounter (Signed)
Left message

## 2018-04-05 ENCOUNTER — Encounter: Payer: Self-pay | Admitting: Gastroenterology

## 2018-06-07 ENCOUNTER — Other Ambulatory Visit: Payer: Self-pay

## 2018-06-07 ENCOUNTER — Ambulatory Visit (HOSPITAL_COMMUNITY)
Admission: EM | Admit: 2018-06-07 | Discharge: 2018-06-07 | Disposition: A | Discharge status: Home / Self Care | Payer: BLUE CROSS/BLUE SHIELD | Attending: Family Medicine | Admitting: Family Medicine

## 2018-06-07 ENCOUNTER — Encounter (HOSPITAL_COMMUNITY): Payer: Self-pay | Admitting: Emergency Medicine

## 2018-06-07 DIAGNOSIS — K047 Periapical abscess without sinus: Secondary | ICD-10-CM | POA: Diagnosis not present

## 2018-06-07 MED ORDER — PENICILLIN V POTASSIUM 500 MG PO TABS: 500.0000 mg | ORAL_TABLET | Freq: Four times a day (QID) | ORAL | 0 refills | Status: AC

## 2018-06-07 NOTE — ED Triage Notes (Signed)
Patient says she has a bridge and has noticed irritation to upper, right .  Patient questions if this is an abscess

## 2018-06-07 NOTE — ED Provider Notes (Signed)
[place]    CSN: [id number] Arrival date & time: 06/07/18  1355     History   Chief Complaint Chief Complaint  Patient presents with  . Dental Pain    HPI Chelsea Beasley is a 67 y.o. female.   Patient is a 67 year old female who presents with approximately 3 days of abscess to right upper gum area near a dental bridge. She has had this dental bridge for many years.   She reports tenderness associated with it and unable to eat on the right side of her mouth.  Her symptoms have been constant and worsening.  She has not taken anything for her symptoms.  She denies any associated fever, chills, body aches, trismus.  She is scheduled to see her dentist next Thursday.   ROS per HPI      Past Medical History:  Diagnosis Date  . Anemia   . Cancer [facility])    breast  . Carpal tunnel syndrome on right   . Heart murmur     Patient Active Problem List   Diagnosis Date Noted  . Routine general medical examination at a health care facility 12/23/2017  . Chest pain 12/23/2017  . Dryness of vagina 11/13/2013  . 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 Anemia 09/24/2008  . MASTECTOMY, LEFT, HX OF 09/24/2007    Past Surgical History:  Procedure Laterality Date  . APPENDECTOMY    . BREAST BIOPSY Left 05/20/2005  . BREAST SURGERY     left, total, 2006, cancer, no reconstruction  . CARPAL TUNNEL RELEASE Right   . MASTECTOMY Left 06/04/2005  . NOSE SURGERY      OB History   None      Home Medications    Prior to Admission medications   Medication Sig Start Date End Date Taking? Authorizing Provider  Multiple Vitamins-Calcium (ONE-A-DAY WOMENS PO) Take by mouth daily.    [provider]  penicillin v potassium (VEETID) 500 MG tablet Take 1 tablet (500 mg total) by mouth 4 (four) times daily for 10 days. 06/07/18 06/17/18  Orvan July, NP    Family History Family History  Problem Relation Age of Onset  . Hypertension Mother   . Cancer Mother    lung  . Hypertension Sister   . Colon cancer Neg Hx   . Esophageal cancer Neg Hx   . Stomach cancer Neg Hx   . Rectal cancer Neg Hx     Social History Social History   Tobacco Use  . Smoking status: Never Smoker  . Smokeless tobacco: Never Used  Substance Use Topics  . Alcohol use: Yes    Comment: Occasional beer  . Drug use: No     Allergies   Patient has no known allergies.   Review of Systems Review of Systems   Physical Exam Triage Vital Signs ED Triage Vitals  Enc Vitals Group     BP 06/07/18 1504 (!) 147/62     Pulse Rate 06/07/18 1504 (!) 57     Resp 06/07/18 1504 17     Temp 06/07/18 1504 97.9 F (36.6 C)     Temp Source 06/07/18 1504 Oral     SpO2 06/07/18 1504 100 %     Weight --      Height --      Head Circumference --      Peak Flow --      Pain Score 06/07/18 1503 10     Pain Loc --  Pain Edu? --      Excl. in [place]? --    No data found.  Updated Vital Signs BP (!) 147/62 (BP Location: Left Arm)   Pulse (!) 57   Temp 97.9 F (36.6 C) (Oral)   Resp 17   LMP 01/27/2005 (Approximate)   SpO2 100%   Visual Acuity Right Eye Distance:   Left Eye Distance:   Bilateral Distance:    Right Eye Near:   Left Eye Near:    Bilateral Near:     Physical Exam  Constitutional: She appears well-developed and well-nourished.  Very pleasant. Non toxic or ill appearing.   HENT:  Head: Normocephalic and atraumatic.  Mouth/Throat: Dental abscesses present.  Dental abscess to the right upper gum area.   Eyes: Conjunctivae are normal.  Pulmonary/Chest: Effort normal.  Musculoskeletal: Normal range of motion.  Neurological: She is alert.  Skin: Skin is warm and dry.  Psychiatric: She has a normal mood and affect.  Nursing note and vitals reviewed.    UC Treatments / Results  Labs (all labs ordered are listed, but only abnormal results are displayed) Labs Reviewed - No data to display  EKG None  Radiology No results  found.  Procedures Procedures (including critical care time)  Medications Ordered in UC Medications - No data to display  Initial Impression / Assessment and Plan / UC Course  I have reviewed the triage vital signs and the nursing notes.  Pertinent labs & imaging results that were available during my care of the patient were reviewed by me and considered in my medical decision making (see chart for details).     Will go ahead and treat the abscess with PCN Naproxen for pain and inflammation Follow up with the dentist as planned.  Final Clinical Impressions(s) / UC Diagnoses   Final diagnoses:  Dental abscess     Discharge Instructions     We are prescribing penicillin for the dental infection He can either take Tylenol extra strength for pain or naproxen for pain Follow-up with the dentist as planned    ED Prescriptions    Medication Sig Dispense Auth. Provider   penicillin v potassium (VEETID) 500 MG tablet Take 1 tablet (500 mg total) by mouth 4 (four) times daily for 10 days. 40 tablet Loura Halt A, NP     Controlled Substance Prescriptions [place] Controlled Substance Registry consulted? Not Applicable   Orvan July, NP 06/07/18 1558

## 2018-06-07 NOTE — Discharge Instructions (Signed)
We are prescribing penicillin for the dental infection He can either take Tylenol extra strength for pain or naproxen for pain Follow-up with the dentist as planned

## 2018-11-04 ENCOUNTER — Other Ambulatory Visit: Payer: Self-pay | Admitting: Family Medicine

## 2018-11-04 DIAGNOSIS — Z1231 Encounter for screening mammogram for malignant neoplasm of breast: Secondary | ICD-10-CM

## 2018-11-07 ENCOUNTER — Telehealth: Payer: Self-pay | Admitting: *Deleted

## 2018-11-07 NOTE — Telephone Encounter (Signed)
Appointment made

## 2018-11-07 NOTE — Telephone Encounter (Signed)
Copied from [place] [id number]. Topic: Appointment Scheduling - Scheduling Inquiry for Clinic >> Nov 04, 2018 12:13 PM Nils Flack, Marland Kitchen wrote: Reason for CRM: pt calling to schedule cpe and labs. Please call [phone]

## 2018-12-14 ENCOUNTER — Other Ambulatory Visit: Payer: Self-pay | Admitting: Internal Medicine

## 2018-12-14 DIAGNOSIS — Z1231 Encounter for screening mammogram for malignant neoplasm of breast: Secondary | ICD-10-CM

## 2019-01-12 ENCOUNTER — Other Ambulatory Visit (HOSPITAL_COMMUNITY)
Admission: RE | Admit: 2019-01-12 | Discharge: 2019-01-12 | Disposition: A | Discharge status: Home / Self Care | Payer: 59 | Source: Ambulatory Visit | Attending: Internal Medicine | Admitting: Internal Medicine

## 2019-01-12 ENCOUNTER — Encounter: Payer: Self-pay | Admitting: Internal Medicine

## 2019-01-12 ENCOUNTER — Other Ambulatory Visit: Payer: Self-pay

## 2019-01-12 ENCOUNTER — Ambulatory Visit (INDEPENDENT_AMBULATORY_CARE_PROVIDER_SITE_OTHER): Payer: 59 | Admitting: Internal Medicine

## 2019-01-12 VITALS — BP 140/80 | HR 70 | Temp 99.0°F | Ht 61.0 in | Wt 136.1 lb

## 2019-01-12 DIAGNOSIS — Z9012 Acquired absence of left breast and nipple: Secondary | ICD-10-CM | POA: Diagnosis not present

## 2019-01-12 DIAGNOSIS — Z0000 Encounter for general adult medical examination without abnormal findings: Secondary | ICD-10-CM

## 2019-01-12 DIAGNOSIS — Z23 Encounter for immunization: Secondary | ICD-10-CM | POA: Diagnosis not present

## 2019-01-12 DIAGNOSIS — C50912 Malignant neoplasm of unspecified site of left female breast: Secondary | ICD-10-CM

## 2019-01-12 DIAGNOSIS — Z01419 Encounter for gynecological examination (general) (routine) without abnormal findings: Secondary | ICD-10-CM | POA: Diagnosis present

## 2019-01-12 DIAGNOSIS — Z780 Asymptomatic menopausal state: Secondary | ICD-10-CM | POA: Insufficient documentation

## 2019-01-12 LAB — COMPREHENSIVE METABOLIC PANEL
ALT: 12 U/L (ref 0–35)
AST: 13 U/L (ref 0–37)
Albumin: 4.3 g/dL (ref 3.5–5.2)
Alkaline Phosphatase: 55 U/L (ref 39–117)
BUN: 12 mg/dL (ref 6–23)
CO2: 29 mEq/L (ref 19–32)
Calcium: 9.7 mg/dL (ref 8.4–10.5)
Chloride: 102 mEq/L (ref 96–112)
Creatinine, Ser: 0.68 mg/dL (ref 0.40–1.20)
GFR: 104.61 mL/min (ref >60.00)
Glucose, Bld: 74 mg/dL (ref 70–99)
Potassium: 4.3 mEq/L (ref 3.5–5.1)
Sodium: 139 mEq/L (ref 135–145)
Total Bilirubin: 0.5 mg/dL (ref 0.2–1.2)
Total Protein: 6.8 g/dL (ref 6.0–8.3)

## 2019-01-12 LAB — CBC WITH DIFFERENTIAL/PLATELET
Basophils Absolute: 0 10*3/uL (ref 0.0–0.1)
Basophils Relative: 0.3 % (ref 0.0–3.0)
Eosinophils Absolute: 0 10*3/uL (ref 0.0–0.7)
Eosinophils Relative: 0.6 % (ref 0.0–5.0)
HCT: 36.7 % (ref 36.0–46.0)
Hemoglobin: 11.9 g/dL — ABNORMAL LOW (ref 12.0–15.0)
Lymphocytes Relative: 32 % (ref 12.0–46.0)
Lymphs Abs: 1.9 10*3/uL (ref 0.7–4.0)
MCHC: 32.3 g/dL (ref 30.0–36.0)
MCV: 89.9 fl (ref 78.0–100.0)
Monocytes Absolute: 0.4 10*3/uL (ref 0.1–1.0)
Monocytes Relative: 7.5 % (ref 3.0–12.0)
Neutro Abs: 3.5 10*3/uL (ref 1.4–7.7)
Neutrophils Relative %: 59.6 % (ref 43.0–77.0)
Platelets: 197 10*3/uL (ref 150.0–400.0)
RBC: 4.09 Mil/uL (ref 3.87–5.11)
RDW: 12.6 % (ref 11.5–15.5)
WBC: 5.9 10*3/uL (ref 4.0–10.5)

## 2019-01-12 LAB — HEMOGLOBIN A1C: Hgb A1c MFr Bld: 4.9 % (ref 4.6–6.5)

## 2019-01-12 LAB — LIPID PANEL
Cholesterol: 197 mg/dL (ref 0–200)
HDL: 92.5 mg/dL (ref >39.00)
LDL Cholesterol: 89 mg/dL (ref 0–99)
NonHDL: 104.49
Total CHOL/HDL Ratio: 2
Triglycerides: 76 mg/dL (ref 0.0–149.0)
VLDL: 15.2 mg/dL (ref 0.0–40.0)

## 2019-01-12 LAB — VITAMIN B12: Vitamin B-12: 385 pg/mL (ref 211–911)

## 2019-01-12 LAB — TSH: TSH: 1.64 u[IU]/mL (ref 0.35–4.50)

## 2019-01-12 LAB — VITAMIN D 25 HYDROXY (VIT D DEFICIENCY, FRACTURES): VITD: 24.69 ng/mL — ABNORMAL LOW (ref 30.00–100.00)

## 2019-01-12 NOTE — Patient Instructions (Signed)
-Nice seeing you today!!  -Lab work today; will notify you when results are available.  -Pneumonia vaccine today.  -Will notify you with Pap smear results.   Preventive Care 66 Years and Older, Female Preventive care refers to lifestyle choices and visits with your health care provider that can promote health and wellness. What does preventive care include?  A yearly physical exam. This is also called an annual well check.  Dental exams once or twice a year.  Routine eye exams. Ask your health care provider how often you should have your eyes checked.  Personal lifestyle choices, including: ? Daily care of your teeth and gums. ? Regular physical activity. ? Eating a healthy diet. ? Avoiding tobacco and drug use. ? Limiting alcohol use. ? Practicing safe sex. ? Taking low-dose aspirin every day. ? Taking vitamin and mineral supplements as recommended by your health care provider. What happens during an annual well check? The services and screenings done by your health care provider during your annual well check will depend on your age, overall health, lifestyle risk factors, and family history of disease. Counseling Your health care provider may ask you questions about your:  Alcohol use.  Tobacco use.  Drug use.  Emotional well-being.  Home and relationship well-being.  Sexual activity.  Eating habits.  History of falls.  Memory and ability to understand (cognition).  Work and work Statistician.  Reproductive health.  Screening You may have the following tests or measurements:  Height, weight, and BMI.  Blood pressure.  Lipid and cholesterol levels. These may be checked every 5 years, or more frequently if you are over 50 years old.  Skin check.  Lung cancer screening. You may have this screening every year starting at age 58 if you have a 30-pack-year history of smoking and currently smoke or have quit within the past 15 years.  Colorectal cancer  screening. All adults should have this screening starting at age 53 and continuing until age 69. You will have tests every 1-10 years, depending on your results and the type of screening test. People at increased risk should start screening at an earlier age. Screening tests may include: ? Guaiac-based fecal occult blood testing. ? Fecal immunochemical test (FIT). ? Stool DNA test. ? Virtual colonoscopy. ? Sigmoidoscopy. During this test, a flexible tube with a tiny camera (sigmoidoscope) is used to examine your rectum and lower colon. The sigmoidoscope is inserted through your anus into your rectum and lower colon. ? Colonoscopy. During this test, a long, thin, flexible tube with a tiny camera (colonoscope) is used to examine your entire colon and rectum.  Hepatitis C blood test.  Hepatitis B blood test.  Sexually transmitted disease (STD) testing.  Diabetes screening. This is done by checking your blood sugar (glucose) after you have not eaten for a while (fasting). You may have this done every 1-3 years.  Bone density scan. This is done to screen for osteoporosis. You may have this done starting at age 55.  Mammogram. This may be done every 1-2 years. Talk to your health care provider about how often you should have regular mammograms. Talk with your health care provider about your test results, treatment options, and if necessary, the need for more tests. Vaccines Your health care provider may recommend certain vaccines, such as:  Influenza vaccine. This is recommended every year.  Tetanus, diphtheria, and acellular pertussis (Tdap, Td) vaccine. You may need a Td booster every 10 years.  Varicella vaccine. You may need  this if you have not been vaccinated.  Zoster vaccine. You may need this after age 82.  Measles, mumps, and rubella (MMR) vaccine. You may need at least one dose of MMR if you were born in 1957 or later. You may also need a second dose.  Pneumococcal 13-valent  conjugate (PCV13) vaccine. One dose is recommended after age 11.  Pneumococcal polysaccharide (PPSV23) vaccine. One dose is recommended after age 7.  Meningococcal vaccine. You may need this if you have certain conditions.  Hepatitis A vaccine. You may need this if you have certain conditions or if you travel or work in places where you may be exposed to hepatitis A.  Hepatitis B vaccine. You may need this if you have certain conditions or if you travel or work in places where you may be exposed to hepatitis B.  Haemophilus influenzae type b (Hib) vaccine. You may need this if you have certain conditions. Talk to your health care provider about which screenings and vaccines you need and how often you need them. This information is not intended to replace advice given to you by your health care provider. Make sure you discuss any questions you have with your health care provider. Document Released: 08/09/2015 Document Revised: 09/02/2017 Document Reviewed: 05/14/2015 Elsevier Interactive Patient Education  2019 [employer].

## 2019-01-12 NOTE — Progress Notes (Signed)
Established Patient Office Visit     CC/Reason for Visit: Annual preventive exam  HPI: Chelsea Beasley is a 68 y.o. female who is coming in today for the above mentioned reasons. Past Medical History is significant for: Left breast cancer status post mastectomy in 2006; she never received chemotherapy or radiation.  She is not a smoker, drinks alcohol occasionally, is divorced, is an IT sales professional for [employer], has 2 daughters.  She has routine eye and dental care.  She is due for PPSV23 today, we have discussed shingles and she will consider, she had a colonoscopy in August 2019 and is a 5-year callback, last mammogram was in June 2019 and it is already scheduled for later this week.  She will receive Pap smear today.   Past Medical/Surgical History: Past Medical History:  Diagnosis Date  . Anemia   . Cancer [facility])    breast  . Carpal tunnel syndrome on right   . Heart murmur     Past Surgical History:  Procedure Laterality Date  . APPENDECTOMY    . BREAST BIOPSY Left 05/20/2005  . BREAST SURGERY     left, total, 2006, cancer, no reconstruction  . CARPAL TUNNEL RELEASE Right   . MASTECTOMY Left 06/04/2005  . NOSE SURGERY      Social History:  reports that she has never smoked. She has never used smokeless tobacco. She reports current alcohol use. She reports that she does not use drugs.  Allergies: No Known Allergies  Family History:  Family History  Problem Relation Age of Onset  . Hypertension Mother   . Cancer Mother        lung  . Hypertension Sister   . Colon cancer Neg Hx   . Esophageal cancer Neg Hx   . Stomach cancer Neg Hx   . Rectal cancer Neg Hx      Current Outpatient Medications:  Marland Kitchen  Multiple Vitamins-Calcium (ONE-A-DAY WOMENS PO), Take by mouth daily., Disp: , Rfl:   Review of Systems:  Constitutional: Denies fever, chills, diaphoresis, appetite change and fatigue.  HEENT: Denies photophobia, eye pain, redness, hearing loss, ear pain,  congestion, sore throat, rhinorrhea, sneezing, mouth sores, trouble swallowing, neck pain, neck stiffness and tinnitus.   Respiratory: Denies SOB, DOE, cough, chest tightness,  and wheezing.   Cardiovascular: Denies chest pain, palpitations and leg swelling.  Gastrointestinal: Denies nausea, vomiting, abdominal pain, diarrhea, constipation, blood in stool and abdominal distention.  Genitourinary: Denies dysuria, urgency, frequency, hematuria, flank pain and difficulty urinating.  Endocrine: Denies: hot or cold intolerance, sweats, changes in hair or nails, polyuria, polydipsia. Musculoskeletal: Denies myalgias, back pain, joint swelling, arthralgias and gait problem.  Skin: Denies pallor, rash and wound.  Neurological: Denies dizziness, seizures, syncope, weakness, light-headedness, numbness and headaches.  Hematological: Denies adenopathy. Easy bruising, personal or family bleeding history  Psychiatric/Behavioral: Denies suicidal ideation, mood changes, confusion, nervousness, sleep disturbance and agitation    Physical Exam: Vitals:   01/12/19 1301  BP: 140/80  Pulse: 70  Temp: 99 F (37.2 C)  TempSrc: Oral  SpO2: 98%  Weight: 136 lb 1.6 oz (61.7 kg)  Height: _0  (1.549 m)    Body mass index is 25.72 kg/m.   Constitutional: NAD, calm, comfortable Eyes: PERRL, lids and conjunctivae normal, wears corrective lenses ENMT: Mucous membranes are moist. Posterior pharynx clear of any exudate or lesions. Normal dentition. Tympanic membrane is pearly white, no erythema or bulging. Neck: normal, supple, no masses, no thyromegaly Respiratory:  clear to auscultation bilaterally, no wheezing, no crackles. Normal respiratory effort. No accessory muscle use.  Cardiovascular: Regular rate and rhythm, no murmurs / rubs / gallops. No extremity edema. 2+ pedal pulses. No carotid bruits.  Abdomen: no tenderness, no masses palpated. No hepatosplenomegaly. Bowel sounds positive.  Musculoskeletal:  no clubbing / cyanosis. No joint deformity upper and lower extremities. Good ROM, no contractures. Normal muscle tone.  Skin: no rashes, lesions, ulcers. No induration Neurologic: CN 2-12 grossly intact. Sensation intact, DTR normal. Strength 5/5 in all 4.  Psychiatric: Normal judgment and insight. Alert and oriented x 3. Normal mood.    Impression and Plan:  Encounter for preventive health examination  -Has routine eye and dental care. -Will receive PPS 23 today, have discussed shingles which she will consider, otherwise immunizations are up-to-date. -DEXA scan today for screening for osteoporosis. -Colonoscopy in August 2019, 5-year callback. -Pap smear has been completed today for cervical cancer screening. -Last mammogram in June 2019, is scheduled for yearly mammogram later this week. -We have discussed healthy lifestyle in detail.  Post-menopausal - Plan: DG Bone Density  Acquired absence of left breast and nipple Malignant neoplasm of left female breast, unspecified estrogen receptor status, unspecified site of breast [facility]) -Noted    Patient Instructions  -Nice seeing you today!!  -Lab work today; will notify you when results are available.  -Pneumonia vaccine today.  -Will notify you with Pap smear results.   Preventive Care 57 Years and Older, Female Preventive care refers to lifestyle choices and visits with your health care provider that can promote health and wellness. What does preventive care include?  A yearly physical exam. This is also called an annual well check.  Dental exams once or twice a year.  Routine eye exams. Ask your health care provider how often you should have your eyes checked.  Personal lifestyle choices, including: ? Daily care of your teeth and gums. ? Regular physical activity. ? Eating a healthy diet. ? Avoiding tobacco and drug use. ? Limiting alcohol use. ? Practicing safe sex. ? Taking low-dose aspirin every day. ? Taking  vitamin and mineral supplements as recommended by your health care provider. What happens during an annual well check? The services and screenings done by your health care provider during your annual well check will depend on your age, overall health, lifestyle risk factors, and family history of disease. Counseling Your health care provider may ask you questions about your:  Alcohol use.  Tobacco use.  Drug use.  Emotional well-being.  Home and relationship well-being.  Sexual activity.  Eating habits.  History of falls.  Memory and ability to understand (cognition).  Work and work Statistician.  Reproductive health.  Screening You may have the following tests or measurements:  Height, weight, and BMI.  Blood pressure.  Lipid and cholesterol levels. These may be checked every 5 years, or more frequently if you are over 43 years old.  Skin check.  Lung cancer screening. You may have this screening every year starting at age 75 if you have a 30-pack-year history of smoking and currently smoke or have quit within the past 15 years.  Colorectal cancer screening. All adults should have this screening starting at age 2 and continuing until age 71. You will have tests every 1-10 years, depending on your results and the type of screening test. People at increased risk should start screening at an earlier age. Screening tests may include: ? Guaiac-based fecal occult blood testing. ? Fecal immunochemical  test (FIT). ? Stool DNA test. ? Virtual colonoscopy. ? Sigmoidoscopy. During this test, a flexible tube with a tiny camera (sigmoidoscope) is used to examine your rectum and lower colon. The sigmoidoscope is inserted through your anus into your rectum and lower colon. ? Colonoscopy. During this test, a long, thin, flexible tube with a tiny camera (colonoscope) is used to examine your entire colon and rectum.  Hepatitis C blood test.  Hepatitis B blood test.  Sexually  transmitted disease (STD) testing.  Diabetes screening. This is done by checking your blood sugar (glucose) after you have not eaten for a while (fasting). You may have this done every 1-3 years.  Bone density scan. This is done to screen for osteoporosis. You may have this done starting at age 57.  Mammogram. This may be done every 1-2 years. Talk to your health care provider about how often you should have regular mammograms. Talk with your health care provider about your test results, treatment options, and if necessary, the need for more tests. Vaccines Your health care provider may recommend certain vaccines, such as:  Influenza vaccine. This is recommended every year.  Tetanus, diphtheria, and acellular pertussis (Tdap, Td) vaccine. You may need a Td booster every 10 years.  Varicella vaccine. You may need this if you have not been vaccinated.  Zoster vaccine. You may need this after age 91.  Measles, mumps, and rubella (MMR) vaccine. You may need at least one dose of MMR if you were born in 1957 or later. You may also need a second dose.  Pneumococcal 13-valent conjugate (PCV13) vaccine. One dose is recommended after age 10.  Pneumococcal polysaccharide (PPSV23) vaccine. One dose is recommended after age 19.  Meningococcal vaccine. You may need this if you have certain conditions.  Hepatitis A vaccine. You may need this if you have certain conditions or if you travel or work in places where you may be exposed to hepatitis A.  Hepatitis B vaccine. You may need this if you have certain conditions or if you travel or work in places where you may be exposed to hepatitis B.  Haemophilus influenzae type b (Hib) vaccine. You may need this if you have certain conditions. Talk to your health care provider about which screenings and vaccines you need and how often you need them. This information is not intended to replace advice given to you by your health care provider. Make sure you  discuss any questions you have with your health care provider. Document Released: 08/09/2015 Document Revised: 09/02/2017 Document Reviewed: 05/14/2015 Elsevier Interactive Patient Education  2019 [place], [place] [facility]

## 2019-01-13 ENCOUNTER — Encounter: Payer: Self-pay | Admitting: Internal Medicine

## 2019-01-13 ENCOUNTER — Other Ambulatory Visit: Payer: Self-pay | Admitting: Internal Medicine

## 2019-01-13 DIAGNOSIS — E559 Vitamin D deficiency, unspecified: Secondary | ICD-10-CM

## 2019-01-13 MED ORDER — VITAMIN D (ERGOCALCIFEROL) 1.25 MG (50000 UNIT) PO CAPS: 50000.0000 [IU] | ORAL_CAPSULE | ORAL | 0 refills | Status: AC

## 2019-01-14 ENCOUNTER — Other Ambulatory Visit: Payer: Self-pay

## 2019-01-14 ENCOUNTER — Ambulatory Visit
Admission: RE | Admit: 2019-01-14 | Discharge: 2019-01-14 | Disposition: A | Discharge status: Home / Self Care | Payer: BLUE CROSS/BLUE SHIELD | Source: Ambulatory Visit | Attending: Internal Medicine | Admitting: Internal Medicine

## 2019-01-14 DIAGNOSIS — Z1231 Encounter for screening mammogram for malignant neoplasm of breast: Secondary | ICD-10-CM

## 2019-01-17 ENCOUNTER — Telehealth: Payer: Self-pay | Admitting: *Deleted

## 2019-01-17 LAB — CYTOLOGY - PAP
Diagnosis: NEGATIVE
HPV: NOT DETECTED

## 2019-01-17 NOTE — Telephone Encounter (Signed)
Copied from [place] [id number]. Topic: General - Other >> Jan 16, 2019 11:34 AM Carolyn Stare wrote: Pt wanted to let Apolonio Schneiders know her insurance will cover Shingrix

## 2019-01-19 NOTE — Telephone Encounter (Signed)
Left message on machine for patient to return our call CRM 

## 2019-01-31 ENCOUNTER — Other Ambulatory Visit: Payer: Self-pay | Admitting: Internal Medicine

## 2019-01-31 DIAGNOSIS — E559 Vitamin D deficiency, unspecified: Secondary | ICD-10-CM

## 2019-01-31 NOTE — Telephone Encounter (Signed)
Patient is aware of lab results.

## 2019-05-03 ENCOUNTER — Other Ambulatory Visit: Payer: Self-pay

## 2019-05-03 ENCOUNTER — Other Ambulatory Visit (INDEPENDENT_AMBULATORY_CARE_PROVIDER_SITE_OTHER): Payer: 59

## 2019-05-03 ENCOUNTER — Other Ambulatory Visit: Payer: Self-pay | Admitting: Internal Medicine

## 2019-05-03 DIAGNOSIS — E559 Vitamin D deficiency, unspecified: Secondary | ICD-10-CM | POA: Diagnosis not present

## 2019-05-03 LAB — VITAMIN D 25 HYDROXY (VIT D DEFICIENCY, FRACTURES): VITD: 38.1 ng/mL (ref 30.00–100.00)

## 2019-05-03 MED ORDER — VITAMIN D (ERGOCALCIFEROL) 1.25 MG (50000 UNIT) PO CAPS: 50000.0000 [IU] | ORAL_CAPSULE | ORAL | 0 refills | Status: AC

## 2019-09-18 ENCOUNTER — Ambulatory Visit: Payer: Self-pay | Attending: Internal Medicine

## 2019-09-18 DIAGNOSIS — Z23 Encounter for immunization: Secondary | ICD-10-CM | POA: Insufficient documentation

## 2019-09-18 NOTE — Progress Notes (Signed)
   Covid-19 Vaccination Clinic  Name:  Chelsea Beasley    MRN: [id number] DOB: 02-13-1951  09/18/2019  [name] was observed post Covid-19 immunization for 15 minutes without incidence. She was provided with Vaccine Information Sheet and instruction to access the V-Safe system.   [name] was instructed to call 911 with any severe reactions post vaccine: Marland Kitchen Difficulty breathing  . Swelling of your face and throat  . A fast heartbeat  . A bad rash all over your body  . Dizziness and weakness    Immunizations Administered    Name Date Dose VIS Date Route   Pfizer COVID-19 Vaccine 09/18/2019  8:16 AM 0.3 mL 07/07/2019 Intramuscular   Manufacturer: [place]   Lot: [id number]   [place]: [id number]

## 2019-10-10 ENCOUNTER — Ambulatory Visit: Payer: Self-pay | Attending: Internal Medicine

## 2019-10-10 DIAGNOSIS — Z23 Encounter for immunization: Secondary | ICD-10-CM

## 2019-10-10 NOTE — Progress Notes (Signed)
   Covid-19 Vaccination Clinic  Name:  TALEIA NISHIHARA    MRN: [id number] DOB: 1950-11-11  10/10/2019  Ms. Bellflower was observed post Covid-19 immunization for 15 minutes without incident. She was provided with Vaccine Information Sheet and instruction to access the V-Safe system.   Ms. Hovious was instructed to call 911 with any severe reactions post vaccine: Marland Kitchen Difficulty breathing  . Swelling of face and throat  . A fast heartbeat  . A bad rash all over body  . Dizziness and weakness   Immunizations Administered    Name Date Dose VIS Date Route   Pfizer COVID-19 Vaccine 10/10/2019  9:22 AM 0.3 mL 07/07/2019 Intramuscular   Manufacturer: [place]   Lot: [id number]   [place]: [id number]

## 2019-11-16 DIAGNOSIS — Z0100 Encounter for examination of eyes and vision without abnormal findings: Secondary | ICD-10-CM | POA: Diagnosis not present

## 2019-11-16 DIAGNOSIS — H524 Presbyopia: Secondary | ICD-10-CM | POA: Diagnosis not present

## 2019-12-26 ENCOUNTER — Other Ambulatory Visit: Payer: Self-pay | Admitting: Internal Medicine

## 2019-12-26 DIAGNOSIS — Z1231 Encounter for screening mammogram for malignant neoplasm of breast: Secondary | ICD-10-CM

## 2020-01-17 ENCOUNTER — Other Ambulatory Visit: Payer: Self-pay

## 2020-01-17 ENCOUNTER — Ambulatory Visit
Admission: RE | Admit: 2020-01-17 | Discharge: 2020-01-17 | Disposition: A | Discharge status: Home / Self Care | Payer: Medicare HMO | Source: Ambulatory Visit | Attending: Internal Medicine | Admitting: Internal Medicine

## 2020-01-17 DIAGNOSIS — Z1231 Encounter for screening mammogram for malignant neoplasm of breast: Secondary | ICD-10-CM

## 2020-02-10 ENCOUNTER — Emergency Department (HOSPITAL_COMMUNITY)
Admission: EM | Admit: 2020-02-10 | Discharge: 2020-02-11 | Disposition: A | Discharge status: Home / Self Care | Payer: Medicare HMO | Attending: Emergency Medicine | Admitting: Emergency Medicine

## 2020-02-10 ENCOUNTER — Other Ambulatory Visit: Payer: Self-pay

## 2020-02-10 ENCOUNTER — Emergency Department (HOSPITAL_COMMUNITY): Payer: Medicare HMO

## 2020-02-10 ENCOUNTER — Encounter (HOSPITAL_COMMUNITY): Payer: Self-pay | Admitting: *Deleted

## 2020-02-10 DIAGNOSIS — R42 Dizziness and giddiness: Secondary | ICD-10-CM | POA: Diagnosis not present

## 2020-02-10 DIAGNOSIS — Z853 Personal history of malignant neoplasm of breast: Secondary | ICD-10-CM | POA: Diagnosis not present

## 2020-02-10 DIAGNOSIS — R011 Cardiac murmur, unspecified: Secondary | ICD-10-CM | POA: Diagnosis not present

## 2020-02-10 DIAGNOSIS — R079 Chest pain, unspecified: Secondary | ICD-10-CM | POA: Diagnosis not present

## 2020-02-10 DIAGNOSIS — R0789 Other chest pain: Secondary | ICD-10-CM | POA: Diagnosis not present

## 2020-02-10 DIAGNOSIS — R5383 Other fatigue: Secondary | ICD-10-CM | POA: Insufficient documentation

## 2020-02-10 DIAGNOSIS — I10 Essential (primary) hypertension: Secondary | ICD-10-CM | POA: Insufficient documentation

## 2020-02-10 DIAGNOSIS — R002 Palpitations: Secondary | ICD-10-CM | POA: Diagnosis not present

## 2020-02-10 DIAGNOSIS — R110 Nausea: Secondary | ICD-10-CM | POA: Diagnosis not present

## 2020-02-10 DIAGNOSIS — R0602 Shortness of breath: Secondary | ICD-10-CM | POA: Diagnosis not present

## 2020-02-10 LAB — BASIC METABOLIC PANEL
Anion gap: 8 (ref 5–15)
BUN: 8 mg/dL (ref 8–23)
CO2: 26 mmol/L (ref 22–32)
Calcium: 9.4 mg/dL (ref 8.9–10.3)
Chloride: 105 mmol/L (ref 98–111)
Creatinine, Ser: 0.66 mg/dL (ref 0.44–1.00)
GFR calc Af Amer: >60 mL/min (ref >60)
GFR calc non Af Amer: >60 mL/min (ref >60)
Glucose, Bld: 88 mg/dL (ref 70–99)
Potassium: 3.7 mmol/L (ref 3.5–5.1)
Sodium: 139 mmol/L (ref 135–145)

## 2020-02-10 LAB — CBC
HCT: 34.4 % — ABNORMAL LOW (ref 36.0–46.0)
Hemoglobin: 10.7 g/dL — ABNORMAL LOW (ref 12.0–15.0)
MCH: 27.9 pg (ref 26.0–34.0)
MCHC: 31.1 g/dL (ref 30.0–36.0)
MCV: 89.6 fL (ref 80.0–100.0)
Platelets: 212 10*3/uL (ref 150–400)
RBC: 3.84 MIL/uL — ABNORMAL LOW (ref 3.87–5.11)
RDW: 12.4 % (ref 11.5–15.5)
WBC: 6.8 10*3/uL (ref 4.0–10.5)
nRBC: 0 % (ref 0.0–0.2)

## 2020-02-10 LAB — TROPONIN I (HIGH SENSITIVITY)
Troponin I (High Sensitivity): 3 ng/L (ref <18)
Troponin I (High Sensitivity): 4 ng/L (ref <18)

## 2020-02-10 MED ORDER — HYDRALAZINE HCL 50 MG PO TABS
50.0000 mg | ORAL_TABLET | Freq: Once | ORAL | Status: AC
  Administered 2020-02-10: 50 mg via ORAL
  Filled 2020-02-10: qty 1

## 2020-02-10 MED ORDER — SODIUM CHLORIDE 0.9 % IV BOLUS
1000.0000 mL | Freq: Once | INTRAVENOUS | Status: AC
  Administered 2020-02-10: 1000 mL via INTRAVENOUS

## 2020-02-10 MED ORDER — SODIUM CHLORIDE 0.9% FLUSH: 3.0000 mL | Freq: Once | INTRAVENOUS | Status: DC

## 2020-02-10 NOTE — Discharge Instructions (Addendum)
Please drink plenty of please take the amlodipine which is a blood pressure medication.  Please follow-up with cardiology.  Please return to the emergency department for any new or concerning symptoms.  He is melatonin up to 10 mg at nighttime  Your heart racing, cardiac enzymes, lab work and chest x-ray were all normal appearing.  This is all very reassuring.  Please drink plenty of water.  Please follow-up closely with cardiology as we discussed.  You may return at anytime to the emergency department for any new or concerning symptoms.

## 2020-02-10 NOTE — ED Triage Notes (Signed)
The pt is c/o chest pain sob nausea dizziness worse last pm   She has had this for 3-4 days

## 2020-02-10 NOTE — ED Provider Notes (Signed)
[facility] Provider Note   CSN: [id number] Arrival date & time: 02/10/20  1657     History Chief Complaint  Patient presents with  . Chest Pain    Chelsea Beasley is a 69 y.o. female.  HPI  Patient is a 69 year old female with past medical history significant for anemia, breast cancer status post entire breast removal with no recurrence.  History of heart murmur for years.  Patient is presented today with complaint of some lightheadedness with standing and heart palpitations.  She states that this began on Tuesday and has been more or less constant since that time.  She states she also had chest pain that began several hours after her heart palpitations.  She states that she has had absolutely 0 episodes of complete cessation of chest pain since they started.  She states that it seems milder than it was when it began however has never gone away.  She describes the chest pain as throbbing and states that she has had some episodes of SOB.  She denies any cough, congestion, fevers, chills.  She denies any orthopnea or PND.  She states that she has never had a history of hypertension is noted on blood pressure medications in the past.  No history of DVT or clots.  She is not a cancer patient currently, no unilateral leg swelling or pain.  She denies any pleuritic or exertional aspect of chest pain and states that it is central nonradiating and seems to be associated with a heart palpitations.  She states no recent surgeries or immobilization.  She denies any cardiac history states she has no first-degree relatives who have had a heart attack and she denies any history of diabetes, CAD, stroke, smoking.  HPI: A 69 year old patient presents for evaluation of chest pain. Initial onset of pain was more than 6 hours ago. The patient's chest pain is described as heaviness/pressure/tightness and is not worse with exertion. The patient's chest pain is not middle- or  left-sided, is not well-localized, is not sharp and does not radiate to the arms/jaw/neck. The patient does not complain of nausea and denies diaphoresis. The patient has no history of stroke, has no history of peripheral artery disease, has not smoked in the past 90 days, denies any history of treated diabetes, has no relevant family history of coronary artery disease (first degree relative at less than age 73), is not hypertensive, has no history of hypercholesterolemia and does not have an elevated BMI (>=30).   Past Medical History:  Diagnosis Date  . Anemia   . Cancer [facility])    breast  . Carpal tunnel syndrome on right   . Heart murmur     Patient Active Problem List   Diagnosis Date Noted  . Vitamin D deficiency 01/13/2019  . Routine general medical examination at a health care facility 12/23/2017  . Chest pain 12/23/2017  . Dryness of vagina 11/13/2013  . 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 Anemia 09/24/2008  . MASTECTOMY, LEFT, HX OF 09/24/2007    Past Surgical History:  Procedure Laterality Date  . APPENDECTOMY    . BREAST BIOPSY Left 05/20/2005  . BREAST SURGERY     left, total, 2006, cancer, no reconstruction  . CARPAL TUNNEL RELEASE Right   . MASTECTOMY Left 06/04/2005  . NOSE SURGERY       OB History   No obstetric history on file.     Family History  Problem Relation Age of Onset  .  Hypertension Mother   . Cancer Mother        lung  . Hypertension Sister   . Colon cancer Neg Hx   . Esophageal cancer Neg Hx   . Stomach cancer Neg Hx   . Rectal cancer Neg Hx     Social History   Tobacco Use  . Smoking status: Never Smoker  . Smokeless tobacco: Never Used  Substance Use Topics  . Alcohol use: Yes    Comment: Occasional beer  . Drug use: No    Home Medications Prior to Admission medications   Medication Sig Start Date End Date Taking? Authorizing Provider  Multiple Vitamins-Calcium (ONE-A-DAY WOMENS PO) Take by mouth daily.    [provider]    Allergies    Patient has no known allergies.  Review of Systems   Review of Systems  Constitutional: Positive for fatigue. Negative for chills and fever.  HENT: Negative for congestion.   Eyes: Negative for pain.  Respiratory: Positive for shortness of breath. Negative for cough.   Cardiovascular: Positive for chest pain and palpitations. Negative for leg swelling.  Gastrointestinal: Negative for abdominal pain, diarrhea, nausea and vomiting.  Genitourinary: Negative for dysuria.  Musculoskeletal: Negative for myalgias.  Skin: Negative for rash.  Neurological: Positive for light-headedness. Negative for dizziness and headaches.    Physical Exam Updated Vital Signs BP (!) 155/82   Pulse (!) 56   Temp 98.3 F (36.8 C) (Oral)   Resp 15   Ht 5' (1.524 m)   Wt 59 kg   LMP 01/27/2005 (Approximate)   SpO2 100%   BMI 25.39 kg/m   Physical Exam Vitals and nursing note reviewed.  Constitutional:      General: She is not in acute distress.    Comments: Pleasant 70 year old female in no acute distress.  HENT:     Head: Normocephalic and atraumatic.     Nose: Nose normal.     Mouth/Throat:     Mouth: Mucous membranes are moist.  Eyes:     General: No scleral icterus. Cardiovascular:     Rate and Rhythm: Normal rate and regular rhythm.     Pulses: Normal pulses.     Heart sounds: Murmur heard.  Systolic murmur is present.      Comments: Patient has gentle holosystolic murmur best auscultated in the right upper sternal border with faint radiation to the carotids.  Pulses are equal bilaterally in the radial and DP pulses 3+.  No rubs or gallops.  No friction rub. Pulmonary:     Effort: Pulmonary effort is normal. No respiratory distress.     Breath sounds: Normal breath sounds. No wheezing.  Abdominal:     Palpations: Abdomen is soft.     Tenderness: There is no abdominal tenderness. There is no guarding or rebound.  Musculoskeletal:     Cervical back: Normal range  of motion.     Right lower leg: No edema.     Left lower leg: No edema.  Skin:    General: Skin is warm and dry.     Capillary Refill: Capillary refill takes less than 2 seconds.  Neurological:     Mental Status: She is alert. Mental status is at baseline.  Psychiatric:        Mood and Affect: Mood normal.        Behavior: Behavior normal.     ED Results / Procedures / Treatments   Labs (all labs ordered are listed, but only abnormal results  are displayed) Labs Reviewed  CBC - Abnormal; Notable for the following components:      Result Value   RBC 3.84 (*)    Hemoglobin 10.7 (*)    HCT 34.4 (*)    All other components within normal limits  BASIC METABOLIC PANEL  TROPONIN I (HIGH SENSITIVITY)  TROPONIN I (HIGH SENSITIVITY)    EKG EKG Interpretation  Date/Time:  Saturday February 10 2020 17:01:34 EDT Ventricular Rate:  66 PR Interval:  142 QRS Duration: 78 QT Interval:  404 QTC Calculation: 423 R Axis:   8 Text Interpretation: Normal sinus rhythm Possible Left atrial enlargement Borderline ECG Confirmed by Veryl Speak [phone]) on 02/10/2020 7:38:36 PM   Radiology DG Chest 2 View  Result Date: 02/10/2020 CLINICAL DATA:  The pt is c/o chest pain sob nausea dizziness worse last pm She has had this for 3-4 days EXAM: CHEST - 2 VIEW COMPARISON:  Chest radiograph 01/03/2018 FINDINGS: The heart size and mediastinal contours are within normal limits. The lungs are clear. No pneumothorax or pleural effusion. The visualized skeletal structures are unremarkable. IMPRESSION: No acute cardiopulmonary finding. Electronically Signed   By: Audie Pinto M.D.   On: 02/10/2020 17:35    Procedures Procedures (including critical care time)  Medications Ordered in ED Medications  sodium chloride flush (NS) 0.9 % injection 3 mL (3 mLs Intravenous Not Given 02/10/20 2031)  hydrALAZINE (APRESOLINE) tablet 50 mg (50 mg Oral Given 02/10/20 2028)    ED Course  I have reviewed the triage  vital signs and the nursing notes.  Pertinent labs & imaging results that were available during my care of the patient were reviewed by me and considered in my medical decision making (see chart for details).  Patient is a 69 year old female presented today with chest pain that has been constant for greater than 24 hours.  Also her palpitations and some lightheadedness that seems to occur most often with standing.  She has had no syncopal or near syncopal episodes.  She has a history of a murmur but states that she has never seen a cardiologist nor had an echocardiogram done in the past.  On my review of EMR I do not see any records of an echocardiogram.  Given that her murmur radiates to the carotids and is best auscultated over the right upper sternal border I have high suspicion for aortic stenosis perhaps patient had bicuspid aortic valve which may be stenosing now.  We will obtain chest pain rule out work-up and plan to have follow-up with cardiology.  Clinical Course as of Feb 10 2336  Sat Feb 10, 2020  2020 Chest x-ray without any acute cardiopulmonary abnormalities.  Agree with radiology read.  No cardiomegaly   [WF]  2020 EKG is normal rate.  No ischemic changes.  Questionable left atrial and LVH.   [WF]  2020 BMP without any electrolyte abnormalities.  CBC without leukocytosis.  There is mild anemia no recent baseline comparison.   [WF]  2250 Patient's blood pressure seems to have dropped dramatically from approximately 200/90 --> 92/50 after she was given 50 mg of hydralazine.  Provide patient with 1 L normal saline because of symptomatic hypotension.  She then recovered her blood pressure 112/65.  She was negative for orthostatic hypotension.  I discussed this case with my attending physician who cosigned this note including patient's presenting symptoms, physical exam, and planned diagnostics and interventions. Attending physician stated agreement with plan or made changes to plan  which were implemented.  Attending physician assessed patient at bedside.  Plan is to monitor patient and discharge home when she is feeling better.  She will follow-up w/ Cardiology.   [WF]  2251 X-ray without any acute cardiopulmonary findings.  Troponin next to within normal limits.  BMP with no electrolyte abnormalities.  CBC without leukocytosis.  Mild anemia 10.7 low suspicion for this is causing his symptoms today--this is mostly unchanged from her baseline anemia.   [WF]    Clinical Course User Index [WF] Tedd Sias, [place]   MDM Rules/Calculators/A&P HEAR Score: 3                        Delta troponins are negative.  Low suspicion for ACS.  Given the patient's consistently elevated blood pressure with approximately 200s/90-100 provided patient with 50 mg of hydralazine.  This was administered and patient shortly after experienced headache, worsening lightheadedness and fatigue.  States that he felt extremely tired.  Blood pressure was rechecked and was found to be low at 92/50 patient was provided 1 L normal saline  I discussed this case with my attending physician who cosigned this note including patient's presenting symptoms, physical exam, and planned diagnostics and interventions. Attending physician stated agreement with plan or made changes to plan which were implemented.   Attending physician assessed patient at bedside.  Patient is PERC negative with a heart score 3.  She is requesting discharge at this time.  I have conducted orthostatic vital signs after she received 1 L of normal saline and she is not orthostatic.  She states that her headache and lightheadedness have resolved and she feels significantly better.  Her blood pressure is now stably in the 120s/60-70s.  She is comfortable discharge at this time and she will follow-up with cardiology on Monday.  She is understanding of strict return precautions to the ED.   Final Clinical Impression(s) / ED  Diagnoses Final diagnoses:  Chest pain, unspecified type  Hypertension, unspecified type  Murmur  Palpitations    Rx / DC Orders ED Discharge Orders    None       Tedd Sias, [place] 02/10/20 2347    Veryl Speak, MD 02/11/20 1101

## 2020-02-10 NOTE — ED Notes (Signed)
Pt stated that she needed to sit down due to increased dizziness and "not feeling well" after standing up at 0 minutes for orthostatic vital signs.

## 2020-02-14 ENCOUNTER — Telehealth (INDEPENDENT_AMBULATORY_CARE_PROVIDER_SITE_OTHER): Payer: Medicare HMO | Admitting: Internal Medicine

## 2020-02-14 ENCOUNTER — Other Ambulatory Visit: Payer: Self-pay

## 2020-02-14 DIAGNOSIS — R002 Palpitations: Secondary | ICD-10-CM | POA: Diagnosis not present

## 2020-02-14 DIAGNOSIS — Z09 Encounter for follow-up examination after completed treatment for conditions other than malignant neoplasm: Secondary | ICD-10-CM | POA: Diagnosis not present

## 2020-02-14 NOTE — Progress Notes (Signed)
Virtual Visit via Telephone Note  I connected with Chelsea Beasley on 02/14/20 at  3:30 PM EDT by telephone and verified that I am speaking with the correct person using two identifiers.   I discussed the limitations, risks, security and privacy concerns of performing an evaluation and management service by telephone and the availability of in person appointments. I also discussed with the patient that there may be a patient responsible charge related to this service. The patient expressed understanding and agreed to proceed.  Location patient: home Location provider: work office Participants present for the call: patient, provider Patient did not have a visit in the prior 7 days to address this/these issue(s).   History of Present Illness:  She was seen in the emergency department on July 17 due to an episode of palpitations and dizziness.  There she was found to have elevated blood pressures of almost 469 systolic.  It appears she was given hydralazine that then caused hypotension so she was kept a few hours to make sure her blood pressure rebounded which it did.  She was discharged home after negative work-up and asked to follow-up with me/cardiology.  She has never seen a cardiologist before.  She called and the earliest appointment they can give her is in October.  Unfortunately since her discharge 4 days ago she continues to have "bad palpitations", pounding in her chest, fluttering.  She denies true chest pain or shortness of breath.  In the ED she was found to have a systolic murmur.   Observations/Objective: Patient sounds cheerful and well on the phone. I do not appreciate any increased work of breathing. Speech and thought processing are grossly intact. Patient reported vitals: None reported   Current Outpatient Medications:  Marland Kitchen  Multiple Vitamins-Calcium (ONE-A-DAY WOMENS PO), Take by mouth daily., Disp: , Rfl:   Review of Systems:  Constitutional: Denies fever, chills,  diaphoresis, appetite change and fatigue.  HEENT: Denies photophobia, eye pain, redness, hearing loss, ear pain, congestion, sore throat, rhinorrhea, sneezing, mouth sores, trouble swallowing, neck pain, neck stiffness and tinnitus.   Respiratory: Denies SOB, DOE, cough, chest tightness,  and wheezing.   Cardiovascular: Denies chest pain and leg swelling.  Gastrointestinal: Denies nausea, vomiting, abdominal pain, diarrhea, constipation, blood in stool and abdominal distention.  Genitourinary: Denies dysuria, urgency, frequency, hematuria, flank pain and difficulty urinating.  Endocrine: Denies: hot or cold intolerance, sweats, changes in hair or nails, polyuria, polydipsia. Musculoskeletal: Denies myalgias, back pain, joint swelling, arthralgias and gait problem.  Skin: Denies pallor, rash and wound.  Neurological: Denies dizziness, seizures, syncope, weakness, light-headedness, numbness and headaches.  Hematological: Denies adenopathy. Easy bruising, personal or family bleeding history  Psychiatric/Behavioral: Denies suicidal ideation, mood changes, confusion, nervousness, sleep disturbance and agitation   Assessment and Plan:  Hospital discharge follow-up Palpitations -Due to continued symptoms, I will get her in the office in the next day or 2 for repeat EKG, cardiac auscultation, consideration of lab work and 2D echo.    I discussed the assessment and treatment plan with the patient. The patient was provided an opportunity to ask questions and all were answered. The patient agreed with the plan and demonstrated an understanding of the instructions.   The patient was advised to call back or seek an in-person evaluation if the symptoms worsen or if the condition fails to improve as anticipated.  I provided 14 minutes of non-face-to-face time during this encounter.   Lelon Frohlich, MD [facility] at [facility]

## 2020-02-16 ENCOUNTER — Ambulatory Visit (INDEPENDENT_AMBULATORY_CARE_PROVIDER_SITE_OTHER): Payer: Medicare HMO | Admitting: Internal Medicine

## 2020-02-16 ENCOUNTER — Encounter: Payer: Self-pay | Admitting: Internal Medicine

## 2020-02-16 ENCOUNTER — Other Ambulatory Visit: Payer: Self-pay

## 2020-02-16 VITALS — BP 120/80 | HR 82 | Temp 99.1°F | Wt 136.4 lb

## 2020-02-16 DIAGNOSIS — I491 Atrial premature depolarization: Secondary | ICD-10-CM

## 2020-02-16 DIAGNOSIS — R002 Palpitations: Secondary | ICD-10-CM

## 2020-02-16 NOTE — Progress Notes (Signed)
Established Patient Office Visit     This visit occurred during the SARS-CoV-2 public health emergency.  Safety protocols were in place, including screening questions prior to the visit, additional usage of staff PPE, and extensive cleaning of exam room while observing appropriate contact time as indicated for disinfecting solutions.    CC/Reason for Visit: Follow-up palpitations  HPI: Chelsea Beasley is a 69 y.o. female who is coming in today for the above mentioned reasons.  This visit was scheduled as a follow-up to a phone consultation that we had on July 21 as a hospital follow-up.  She was seen in the ED on July 17 due to palpitations and dizziness.  She was discharged home with a negative work-up.  At the time of her phone visit she had mentioned continued palpitations so I requested she come in person to the visit today.  She still does have occasional palpitations and what she believes is decreased exercise tolerance and fatigue, no true chest pain or shortness of breath.   Past Medical/Surgical History: Past Medical History:  Diagnosis Date  . Anemia   . Cancer [facility])    breast  . Carpal tunnel syndrome on right   . Heart murmur     Past Surgical History:  Procedure Laterality Date  . APPENDECTOMY    . BREAST BIOPSY Left 05/20/2005  . BREAST SURGERY     left, total, 2006, cancer, no reconstruction  . CARPAL TUNNEL RELEASE Right   . MASTECTOMY Left 06/04/2005  . NOSE SURGERY      Social History:  reports that she has never smoked. She has never used smokeless tobacco. She reports current alcohol use. She reports that she does not use drugs.  Allergies: No Known Allergies  Family History:  Family History  Problem Relation Age of Onset  . Hypertension Mother   . Cancer Mother        lung  . Hypertension Sister   . Colon cancer Neg Hx   . Esophageal cancer Neg Hx   . Stomach cancer Neg Hx   . Rectal cancer Neg Hx      Current Outpatient Medications:   Marland Kitchen  Multiple Vitamins-Calcium (ONE-A-DAY WOMENS PO), Take by mouth daily., Disp: , Rfl:   Review of Systems:  Constitutional: Denies fever, chills, diaphoresis, appetite change. HEENT: Denies photophobia, eye pain, redness, hearing loss, ear pain, congestion, sore throat, rhinorrhea, sneezing, mouth sores, trouble swallowing, neck pain, neck stiffness and tinnitus.   Respiratory: Denies SOB, DOE, cough, chest tightness,  and wheezing.   Cardiovascular: Denies chest pain and leg swelling.  Gastrointestinal: Denies nausea, vomiting, abdominal pain, diarrhea, constipation, blood in stool and abdominal distention.  Genitourinary: Denies dysuria, urgency, frequency, hematuria, flank pain and difficulty urinating.  Endocrine: Denies: hot or cold intolerance, sweats, changes in hair or nails, polyuria, polydipsia. Musculoskeletal: Denies myalgias, back pain, joint swelling, arthralgias and gait problem.  Skin: Denies pallor, rash and wound.  Neurological: Denies dizziness, seizures, syncope, weakness, light-headedness, numbness and headaches.  Hematological: Denies adenopathy. Easy bruising, personal or family bleeding history  Psychiatric/Behavioral: Denies suicidal ideation, mood changes, confusion, nervousness, sleep disturbance and agitation    Physical Exam: Vitals:   02/16/20 1526  BP: 120/80  Pulse: 82  Temp: 99.1 F (37.3 C)  TempSrc: Oral  SpO2: 96%  Weight: 136 lb 6.4 oz (61.9 kg)    Body mass index is 26.64 kg/m.   Constitutional: NAD, calm, comfortable Eyes: PERRL, lids and conjunctivae normal ENMT: Mucous  membranes are moist.  Respiratory: clear to auscultation bilaterally, no wheezing, no crackles. Normal respiratory effort. No accessory muscle use.  Cardiovascular: Regular rate and rhythm, no murmurs / rubs / gallops. No extremity edema.  Neurologic: Grossly intact and nonfocal Psychiatric: Normal judgment and insight. Alert and oriented x 3. Normal mood.     Impression and Plan:  Palpitations  PAC (premature atrial contraction)   -EKG done in office today shows sinus rhythm at a rate of 66, no acute ST or T wave changes, 1 PAC was identified. -She had a regular heart rate with no extra beats on cardiac auscultation. -I suspect that she may feel the palpitations when she has more frequent PACs. -Given her decreased exercise tolerance and fatigue, I will order a 2D echo to start her work-up, if palpitations continue can consider event monitor and cardiology referral.    Patient Instructions  -Nice seeing you today!!  -We will arrange for you to have a heart echocardiogram.  -Schedule follow up in 3 months.     Lelon Frohlich, MD [facility]

## 2020-02-16 NOTE — Patient Instructions (Signed)
-  Nice seeing you today!!  -We will arrange for you to have a heart echocardiogram.  -Schedule follow up in 3 months.

## 2020-03-20 ENCOUNTER — Ambulatory Visit: Payer: Medicare HMO | Admitting: Cardiovascular Disease

## 2020-04-25 ENCOUNTER — Encounter: Payer: Medicare HMO | Admitting: Internal Medicine

## 2020-04-30 ENCOUNTER — Other Ambulatory Visit: Payer: Self-pay

## 2020-04-30 ENCOUNTER — Encounter: Payer: Self-pay | Admitting: Internal Medicine

## 2020-04-30 ENCOUNTER — Ambulatory Visit (INDEPENDENT_AMBULATORY_CARE_PROVIDER_SITE_OTHER): Payer: Medicare HMO | Admitting: Internal Medicine

## 2020-04-30 VITALS — BP 120/80 | HR 70 | Temp 98.7°F | Ht 61.0 in | Wt 135.5 lb

## 2020-04-30 DIAGNOSIS — M25462 Effusion, left knee: Secondary | ICD-10-CM

## 2020-04-30 DIAGNOSIS — E559 Vitamin D deficiency, unspecified: Secondary | ICD-10-CM

## 2020-04-30 DIAGNOSIS — M25562 Pain in left knee: Secondary | ICD-10-CM | POA: Diagnosis not present

## 2020-04-30 DIAGNOSIS — Z0000 Encounter for general adult medical examination without abnormal findings: Secondary | ICD-10-CM

## 2020-04-30 DIAGNOSIS — D649 Anemia, unspecified: Secondary | ICD-10-CM | POA: Diagnosis not present

## 2020-04-30 DIAGNOSIS — Z23 Encounter for immunization: Secondary | ICD-10-CM | POA: Diagnosis not present

## 2020-04-30 NOTE — Progress Notes (Addendum)
Established Patient Office Visit     This visit occurred during the SARS-CoV-2 public health emergency.  Safety protocols were in place, including screening questions prior to the visit, additional usage of staff PPE, and extensive cleaning of exam room while observing appropriate contact time as indicated for disinfecting solutions.    CC/Reason for Visit: Annual preventive exam and Initial Medicare wellness visit.  Discussed pain and swelling behind left knee  HPI: Chelsea Beasley is a 69 y.o. female who is coming in today for the above mentioned reasons. Past Medical History is significant for: Left breast cancer status postmastectomy in 2006.  She has routine eye and dental care.  Cancer screening is up-to-date, she will be getting her Covid booster tomorrow, is requesting flu vaccine today.  She has yet to have her shingles vaccination.  For the last 3 to 4 weeks she has noticed intermittent swelling behind her left knee.  She plays pickle ball and this gets worse with activity.  Other than that she has no acute complaints.   Past Medical/Surgical History: Past Medical History:  Diagnosis Date   Anemia    Cancer ([place])    breast   Carpal tunnel syndrome on right    Heart murmur     Past Surgical History:  Procedure Laterality Date   APPENDECTOMY     BREAST BIOPSY Left 05/20/2005   BREAST SURGERY     left, total, 2006, cancer, no reconstruction   CARPAL TUNNEL RELEASE Right    MASTECTOMY Left 06/04/2005   NOSE SURGERY      Social History:  reports that she has never smoked. She has never used smokeless tobacco. She reports current alcohol use. She reports that she does not use drugs.  Allergies: No Known Allergies  Family History:  Family History  Problem Relation Age of Onset   Hypertension Mother    Cancer Mother        lung   Hypertension Sister    Colon cancer Neg Hx    Esophageal cancer Neg Hx    Stomach cancer Neg Hx    Rectal cancer  Neg Hx      Current Outpatient Medications:    Multiple Vitamins-Calcium (ONE-A-DAY WOMENS PO), Take by mouth daily., Disp: , Rfl:   Review of Systems:  Constitutional: Denies fever, chills, diaphoresis, appetite change and fatigue.  HEENT: Denies photophobia, eye pain, redness, hearing loss, ear pain, congestion, sore throat, rhinorrhea, sneezing, mouth sores, trouble swallowing, neck pain, neck stiffness and tinnitus.   Respiratory: Denies SOB, DOE, cough, chest tightness,  and wheezing.   Cardiovascular: Denies chest pain, palpitations and leg swelling.  Gastrointestinal: Denies nausea, vomiting, abdominal pain, diarrhea, constipation, blood in stool and abdominal distention.  Genitourinary: Denies dysuria, urgency, frequency, hematuria, flank pain and difficulty urinating.  Endocrine: Denies: hot or cold intolerance, sweats, changes in hair or nails, polyuria, polydipsia. Musculoskeletal: Denies myalgias, back pain,arthralgias and gait problem.  Skin: Denies pallor, rash and wound.  Neurological: Denies dizziness, seizures, syncope, weakness, light-headedness, numbness and headaches.  Hematological: Denies adenopathy. Easy bruising, personal or family bleeding history  Psychiatric/Behavioral: Denies suicidal ideation, mood changes, confusion, nervousness, sleep disturbance and agitation    Physical Exam: Vitals:   04/30/20 1033  BP: 120/80  Pulse: 70  Temp: 98.7 F (37.1 C)  TempSrc: Oral  SpO2: 97%  Weight: 135 lb 8 oz (61.5 kg)  Height: 5' 1"  (1.549 m)    Body mass index is 25.6 kg/m.   Constitutional:  NAD, calm, comfortable Eyes: PERRL, lids and conjunctivae normal, wears corrective lenses ENMT: Mucous membranes are moist.  Tympanic membrane is pearly white, no erythema or bulging. Neck: normal, supple, no masses, no thyromegaly Respiratory: clear to auscultation bilaterally, no wheezing, no crackles. Normal respiratory effort. No accessory muscle use.   Cardiovascular: Regular rate and rhythm, no murmurs / rubs / gallops. No extremity edema. 2+ pedal pulses.   Abdomen: no tenderness, no masses palpated. No hepatosplenomegaly. Bowel sounds positive.  Musculoskeletal: no clubbing / cyanosis. No joint deformity upper and lower extremities. Good ROM, no contractures. Normal muscle tone.  Skin: no rashes, lesions, ulcers. No induration Neurologic: CN 2-12 grossly intact. Sensation intact, DTR normal. Strength 5/5 in all 4.  Psychiatric: Normal judgment and insight. Alert and oriented x 3. Normal mood.   Subsequent Medicare wellness visit   1. Risk factors, based on past  M,S,F -cardiovascular disease risk factors include age only   2.  Physical activities: She remains very active by walking and playing pickle ball   3.  Depression/mood:  Stable, not depressed   4.  Hearing:  No perceived issues   5.  ADL's: Independent in all ADLs   6.  Fall risk:  Low fall risk   7.  Home safety: No problems identified   8.  Height weight, and visual acuity: height and weight as above, vision:   Visual Acuity Screening   Right eye Left eye Both eyes  Without correction:     With correction: 20/20 20/20 20/20      9.  Counseling:  Follow-up in 1 year, continue healthy lifestyle changes   10. Lab orders based on risk factors: Laboratory update will be reviewed   11. Referral :  None today   12. Care plan:  Left lower extremity ultrasound to be ordered today to rule out Baker's cyst of the left leg   13. Cognitive assessment:  No cognitive impairment   14. Screening: Patient provided with a written and personalized 5-10 year screening schedule in the AVS.   yes   15. Provider List Update:   PCP only  16. Advance Directives: Full code     Office Visit from 04/30/2020 in [facility] [place]  PHQ-9 Total Score 0      Fall Risk  02/14/2020 01/12/2019 12/23/2017  Falls in the past year? 0 0 No  Number falls in past yr: 0 0 -   Injury with Fall? 0 0 -     Impression and Plan:  Encounter for preventive health examination  -She has routine eye and dental care. -Flu vaccine today, she will be getting her Covid booster tomorrow, she is due for shingles which she declines today, otherwise immunizations are up-to-date. -Healthy lifestyle discussed in detail. -She had a Pap smear in 2020, negative mammogram in June 2021, colonoscopy in 2019 and is today 5-year callback.  Pain and swelling of left knee  -Suspect Baker's cyst, nonruptured. - Plan: VAS [place] LOWER EXTREMITY VENOUS (DVT)  Vitamin D deficiency  - Plan: VITAMIN D 25 Hydroxy (Vit-D Deficiency, Fractures)  Need for influenza vaccination -Flu vaccine administered today.   Patient Instructions  -Nice seeing you today!!  -Lab work today; will notify you once results are available.  -Flu vaccine today.  -Will order ultrasound of your left leg today.  -Schedule follow up in 1 year or sooner as needed.   Preventive Care 13 Years and Older, Female Preventive care refers to lifestyle choices and visits with your  health care provider that can promote health and wellness. This includes:  A yearly physical exam. This is also called an annual well check.  Regular dental and eye exams.  Immunizations.  Screening for certain conditions.  Healthy lifestyle choices, such as diet and exercise. What can I expect for my preventive care visit? Physical exam Your health care provider will check:  Height and weight. These may be used to calculate body mass index (BMI), which is a measurement that tells if you are at a healthy weight.  Heart rate and blood pressure.  Your skin for abnormal spots. Counseling Your health care provider may ask you questions about:  Alcohol, tobacco, and drug use.  Emotional well-being.  Home and relationship well-being.  Sexual activity.  Eating habits.  History of falls.  Memory and ability to understand  (cognition).  Work and work Statistician.  Pregnancy and menstrual history. What immunizations do I need?  Influenza (flu) vaccine  This is recommended every year. Tetanus, diphtheria, and pertussis (Tdap) vaccine  You may need a Td booster every 10 years. Varicella (chickenpox) vaccine  You may need this vaccine if you have not already been vaccinated. Zoster (shingles) vaccine  You may need this after age 68. Pneumococcal conjugate (PCV13) vaccine  One dose is recommended after age 61. Pneumococcal polysaccharide (PPSV23) vaccine  One dose is recommended after age 10. Measles, mumps, and rubella (MMR) vaccine  You may need at least one dose of MMR if you were born in 1957 or later. You may also need a second dose. Meningococcal conjugate (MenACWY) vaccine  You may need this if you have certain conditions. Hepatitis A vaccine  You may need this if you have certain conditions or if you travel or work in places where you may be exposed to hepatitis A. Hepatitis B vaccine  You may need this if you have certain conditions or if you travel or work in places where you may be exposed to hepatitis B. Haemophilus influenzae type b (Hib) vaccine  You may need this if you have certain conditions. You may receive vaccines as individual doses or as more than one vaccine together in one shot (combination vaccines). Talk with your health care provider about the risks and benefits of combination vaccines. What tests do I need? Blood tests  Lipid and cholesterol levels. These may be checked every 5 years, or more frequently depending on your overall health.  Hepatitis C test.  Hepatitis B test. Screening  Lung cancer screening. You may have this screening every year starting at age 26 if you have a 30-pack-year history of smoking and currently smoke or have quit within the past 15 years.  Colorectal cancer screening. All adults should have this screening starting at age 39 and  continuing until age 93. Your health care provider may recommend screening at age 25 if you are at increased risk. You will have tests every 1-10 years, depending on your results and the type of screening test.  Diabetes screening. This is done by checking your blood sugar (glucose) after you have not eaten for a while (fasting). You may have this done every 1-3 years.  Mammogram. This may be done every 1-2 years. Talk with your health care provider about how often you should have regular mammograms.  BRCA-related cancer screening. This may be done if you have a family history of breast, ovarian, tubal, or peritoneal cancers. Other tests  Sexually transmitted disease (STD) testing.  Bone density scan. This is done to  screen for osteoporosis. You may have this done starting at age 42. Follow these instructions at home: Eating and drinking  Eat a diet that includes fresh fruits and vegetables, whole grains, lean protein, and low-fat dairy products. Limit your intake of foods with high amounts of sugar, saturated fats, and salt.  Take vitamin and mineral supplements as recommended by your health care provider.  Do not drink alcohol if your health care provider tells you not to drink.  If you drink alcohol: ? Limit how much you have to 0-1 drink a day. ? Be aware of how much alcohol is in your drink. In the [place] one drink equals one 12 oz bottle of beer (355 mL), one 5 oz glass of wine (148 mL), or one 1 oz glass of hard liquor (44 mL). Lifestyle  Take daily care of your teeth and gums.  Stay active. Exercise for at least 30 minutes on 5 or more days each week.  Do not use any products that contain nicotine or tobacco, such as cigarettes, e-cigarettes, and chewing tobacco. If you need help quitting, ask your health care provider.  If you are sexually active, practice safe sex. Use a condom or other form of protection in order to prevent STIs (sexually transmitted infections).  Talk  with your health care provider about taking a low-dose aspirin or statin. What's next?  Go to your health care provider once a year for a well check visit.  Ask your health care provider how often you should have your eyes and teeth checked.  Stay up to date on all vaccines. This information is not intended to replace advice given to you by your health care provider. Make sure you discuss any questions you have with your health care provider. Document Revised: 07/07/2018 Document Reviewed: 07/07/2018 Elsevier Patient Education  2020 [place], [place] [facility]

## 2020-04-30 NOTE — Addendum Note (Signed)
Addended by: Westley Hummer B on: 04/30/2020 05:10 PM   Modules accepted: Orders

## 2020-04-30 NOTE — Patient Instructions (Signed)
-Nice seeing you today!!  -Lab work today; will notify you once results are available.  -Flu vaccine today.  -Will order ultrasound of your left leg today.  -Schedule follow up in 1 year or sooner as needed.   Preventive Care 67 Years and Older, Female Preventive care refers to lifestyle choices and visits with your health care provider that can promote health and wellness. This includes:  A yearly physical exam. This is also called an annual well check.  Regular dental and eye exams.  Immunizations.  Screening for certain conditions.  Healthy lifestyle choices, such as diet and exercise. What can I expect for my preventive care visit? Physical exam Your health care provider will check:  Height and weight. These may be used to calculate body mass index (BMI), which is a measurement that tells if you are at a healthy weight.  Heart rate and blood pressure.  Your skin for abnormal spots. Counseling Your health care provider may ask you questions about:  Alcohol, tobacco, and drug use.  Emotional well-being.  Home and relationship well-being.  Sexual activity.  Eating habits.  History of falls.  Memory and ability to understand (cognition).  Work and work Statistician.  Pregnancy and menstrual history. What immunizations do I need?  Influenza (flu) vaccine  This is recommended every year. Tetanus, diphtheria, and pertussis (Tdap) vaccine  You may need a Td booster every 10 years. Varicella (chickenpox) vaccine  You may need this vaccine if you have not already been vaccinated. Zoster (shingles) vaccine  You may need this after age 28. Pneumococcal conjugate (PCV13) vaccine  One dose is recommended after age 75. Pneumococcal polysaccharide (PPSV23) vaccine  One dose is recommended after age 23. Measles, mumps, and rubella (MMR) vaccine  You may need at least one dose of MMR if you were born in 1957 or later. You may also need a second  dose. Meningococcal conjugate (MenACWY) vaccine  You may need this if you have certain conditions. Hepatitis A vaccine  You may need this if you have certain conditions or if you travel or work in places where you may be exposed to hepatitis A. Hepatitis B vaccine  You may need this if you have certain conditions or if you travel or work in places where you may be exposed to hepatitis B. Haemophilus influenzae type b (Hib) vaccine  You may need this if you have certain conditions. You may receive vaccines as individual doses or as more than one vaccine together in one shot (combination vaccines). Talk with your health care provider about the risks and benefits of combination vaccines. What tests do I need? Blood tests  Lipid and cholesterol levels. These may be checked every 5 years, or more frequently depending on your overall health.  Hepatitis C test.  Hepatitis B test. Screening  Lung cancer screening. You may have this screening every year starting at age 27 if you have a 30-pack-year history of smoking and currently smoke or have quit within the past 15 years.  Colorectal cancer screening. All adults should have this screening starting at age 33 and continuing until age 71. Your health care provider may recommend screening at age 22 if you are at increased risk. You will have tests every 1-10 years, depending on your results and the type of screening test.  Diabetes screening. This is done by checking your blood sugar (glucose) after you have not eaten for a while (fasting). You may have this done every 1-3 years.  Mammogram. This  may be done every 1-2 years. Talk with your health care provider about how often you should have regular mammograms.  BRCA-related cancer screening. This may be done if you have a family history of breast, ovarian, tubal, or peritoneal cancers. Other tests  Sexually transmitted disease (STD) testing.  Bone density scan. This is done to screen for  osteoporosis. You may have this done starting at age 86. Follow these instructions at home: Eating and drinking  Eat a diet that includes fresh fruits and vegetables, whole grains, lean protein, and low-fat dairy products. Limit your intake of foods with high amounts of sugar, saturated fats, and salt.  Take vitamin and mineral supplements as recommended by your health care provider.  Do not drink alcohol if your health care provider tells you not to drink.  If you drink alcohol: ? Limit how much you have to 0-1 drink a day. ? Be aware of how much alcohol is in your drink. In the [place] one drink equals one 12 oz bottle of beer (355 mL), one 5 oz glass of wine (148 mL), or one 1 oz glass of hard liquor (44 mL). Lifestyle  Take daily care of your teeth and gums.  Stay active. Exercise for at least 30 minutes on 5 or more days each week.  Do not use any products that contain nicotine or tobacco, such as cigarettes, e-cigarettes, and chewing tobacco. If you need help quitting, ask your health care provider.  If you are sexually active, practice safe sex. Use a condom or other form of protection in order to prevent STIs (sexually transmitted infections).  Talk with your health care provider about taking a low-dose aspirin or statin. What's next?  Go to your health care provider once a year for a well check visit.  Ask your health care provider how often you should have your eyes and teeth checked.  Stay up to date on all vaccines. This information is not intended to replace advice given to you by your health care provider. Make sure you discuss any questions you have with your health care provider. Document Revised: 07/07/2018 Document Reviewed: 07/07/2018 Elsevier Patient Education  2020 [employer].

## 2020-05-01 LAB — VITAMIN B12: Vitamin B-12: 395 pg/mL (ref 200–1100)

## 2020-05-01 LAB — CBC WITH DIFFERENTIAL/PLATELET
Absolute Monocytes: 323 cells/uL (ref 200–950)
Basophils Absolute: 20 cells/uL (ref 0–200)
Basophils Relative: 0.4 %
Eosinophils Absolute: 29 cells/uL (ref 15–500)
Eosinophils Relative: 0.6 %
HCT: 34.6 % — ABNORMAL LOW (ref 35.0–45.0)
Hemoglobin: 11.4 g/dL — ABNORMAL LOW (ref 11.7–15.5)
Lymphs Abs: 1387 cells/uL (ref 850–3900)
MCH: 28.9 pg (ref 27.0–33.0)
MCHC: 32.9 g/dL (ref 32.0–36.0)
MCV: 87.8 fL (ref 80.0–100.0)
MPV: 12.3 fL (ref 7.5–12.5)
Monocytes Relative: 6.6 %
Neutro Abs: 3141 cells/uL (ref 1500–7800)
Neutrophils Relative %: 64.1 %
Platelets: 247 10*3/uL (ref 140–400)
RBC: 3.94 10*6/uL (ref 3.80–5.10)
RDW: 11.8 % (ref 11.0–15.0)
Total Lymphocyte: 28.3 %
WBC: 4.9 10*3/uL (ref 3.8–10.8)

## 2020-05-01 LAB — COMPREHENSIVE METABOLIC PANEL
AG Ratio: 1.6 (calc) (ref 1.0–2.5)
ALT: 10 U/L (ref 6–29)
AST: 14 U/L (ref 10–35)
Albumin: 4.1 g/dL (ref 3.6–5.1)
Alkaline phosphatase (APISO): 67 U/L (ref 37–153)
BUN: 13 mg/dL (ref 7–25)
CO2: 30 mmol/L (ref 20–32)
Calcium: 9.6 mg/dL (ref 8.6–10.4)
Chloride: 106 mmol/L (ref 98–110)
Creat: 0.71 mg/dL (ref 0.50–0.99)
Globulin: 2.6 g/dL (calc) (ref 1.9–3.7)
Glucose, Bld: 85 mg/dL (ref 65–99)
Potassium: 4.4 mmol/L (ref 3.5–5.3)
Sodium: 141 mmol/L (ref 135–146)
Total Bilirubin: 0.7 mg/dL (ref 0.2–1.2)
Total Protein: 6.7 g/dL (ref 6.1–8.1)

## 2020-05-01 LAB — HEMOGLOBIN A1C
Hgb A1c MFr Bld: 4.6 % of total Hgb (ref <5.7)
Mean Plasma Glucose: 85 (calc)
eAG (mmol/L): 4.7 (calc)

## 2020-05-01 LAB — LIPID PANEL
Cholesterol: 183 mg/dL (ref <200)
HDL: 86 mg/dL (ref >=50)
LDL Cholesterol (Calc): 84 mg/dL (calc)
Non-HDL Cholesterol (Calc): 97 mg/dL (calc) (ref <130)
Total CHOL/HDL Ratio: 2.1 (calc) (ref <5.0)
Triglycerides: 47 mg/dL (ref <150)

## 2020-05-01 LAB — TSH: TSH: 1.28 mIU/L (ref 0.40–4.50)

## 2020-05-01 LAB — VITAMIN D 25 HYDROXY (VIT D DEFICIENCY, FRACTURES): Vit D, 25-Hydroxy: 18 ng/mL — ABNORMAL LOW (ref 30–100)

## 2020-05-03 ENCOUNTER — Ambulatory Visit (HOSPITAL_COMMUNITY)
Admission: RE | Admit: 2020-05-03 | Discharge: 2020-05-03 | Disposition: A | Discharge status: Home / Self Care | Payer: Medicare HMO | Source: Ambulatory Visit | Attending: Cardiovascular Disease | Admitting: Cardiovascular Disease

## 2020-05-03 ENCOUNTER — Other Ambulatory Visit: Payer: Self-pay

## 2020-05-03 DIAGNOSIS — M25562 Pain in left knee: Secondary | ICD-10-CM

## 2020-05-03 DIAGNOSIS — R600 Localized edema: Secondary | ICD-10-CM

## 2020-05-03 DIAGNOSIS — M25462 Effusion, left knee: Secondary | ICD-10-CM | POA: Diagnosis not present

## 2020-05-10 ENCOUNTER — Telehealth: Payer: Self-pay

## 2020-05-10 DIAGNOSIS — M7120 Synovial cyst of popliteal space [Baker], unspecified knee: Secondary | ICD-10-CM

## 2020-05-10 NOTE — Telephone Encounter (Signed)
She just had an [place] with a Baker's cyst, I wonder if it ruptured. I had put in a referral for sports medicine. Can we find out if she has heard from them yet?

## 2020-05-10 NOTE — Telephone Encounter (Signed)
Patient called in wanting to know if the Dr would prescribe her some pain medication for her knee. States that its hard to walk and its very swollen. Warm to touch as well. Says the whole kneecap is swollen pretty bad    Please advise

## 2020-05-11 NOTE — Telephone Encounter (Signed)
I did not see the referral. I entered it.   [facility],   Can you call patient for an appointment to address the knee swelling.   Recent bakers cyst noted on [place].

## 2020-05-13 ENCOUNTER — Encounter: Payer: Self-pay | Admitting: Family Medicine

## 2020-05-13 ENCOUNTER — Other Ambulatory Visit: Payer: Self-pay

## 2020-05-13 ENCOUNTER — Ambulatory Visit: Payer: Medicare HMO | Admitting: Family Medicine

## 2020-05-13 ENCOUNTER — Ambulatory Visit: Payer: Self-pay

## 2020-05-13 VITALS — BP 110/62 | HR 88 | Ht 61.0 in | Wt 134.0 lb

## 2020-05-13 DIAGNOSIS — M7122 Synovial cyst of popliteal space [Baker], left knee: Secondary | ICD-10-CM

## 2020-05-13 DIAGNOSIS — M25562 Pain in left knee: Secondary | ICD-10-CM | POA: Diagnosis not present

## 2020-05-13 NOTE — Patient Instructions (Signed)
Thank you for coming in today.  Call or go to the ER if you develop a large red swollen joint with extreme pain or oozing puss.   You have a baker cyst likely from knee arthritis.  Plan for aspiration and injection today.  Use a compression sleeve.   I recommend you obtained a compression sleeve to help with your joint problems. There are many options on the market however I recommend obtaining a full knee Body Helix compression sleeve.  You can find information (including how to appropriate measure yourself for sizing) can be found at www.Body http://www.lambert.com/.  Many of these products are health savings account (HSA) eligible.   You can use the compression sleeve at any time throughout the day but is most important to use while being active as well as for 2 hours post-activity.   It is appropriate to ice following activity with the compression sleeve in place.   Please use voltaren gel up to 4x daily for pain as needed.    Recheck if not better.    Baker Cyst  A Baker cyst, also called a popliteal cyst, is a growth that forms at the back of the knee. The cyst forms when the fluid-filled sac (bursa) that cushions the knee joint becomes enlarged. What are the causes? In most cases, a Baker cyst results from another knee problem that causes swelling inside the knee. This makes the fluid inside the knee joint (synovial fluid) flow into the bursa behind the knee, causing the bursa to enlarge. What increases the risk? You may be more likely to develop a Baker cyst if you already have a knee problem, such as:  A tear in cartilage that cushions the knee joint (meniscal tear).  A tear in the tissues that connect the bones of the knee joint (ligament tear).  Knee swelling from osteoarthritis, rheumatoid arthritis, or gout. What are the signs or symptoms? The main symptom of this condition is a lump behind the knee. This may be the only symptom of the condition. The lump may be painful, especially  when the knee is straightened. If the lump is painful, the pain may come and go. The knee may also be stiff. Symptoms may quickly get more severe if the cyst breaks open (ruptures). If the cyst ruptures, you may feel the following in your knee and calf:  Sudden or worsening pain.  Swelling.  Bruising.  Redness in the calf. A Baker cyst does not always cause symptoms. How is this diagnosed? This condition may be diagnosed based on your symptoms and medical history. Your health care provider will also do a physical exam. This may include:  Feeling the cyst to check whether it is tender.  Checking your knee for signs of another knee condition that causes swelling. You may have imaging tests, such as:  X-rays.  MRI.  Ultrasound. How is this treated? A Baker cyst that is not painful may go away without treatment. If the cyst gets large or painful, it will likely get better if the underlying knee problem is treated. If needed, treatment for a Baker cyst may include:  Resting.  Keeping weight off of the knee. This means not leaning on the knee to support your body weight.  Taking NSAIDs, such as ibuprofen, to reduce pain and swelling.  Having a procedure to drain the fluid from the cyst with a needle (aspiration). You may also get an injection of a medicine that reduces swelling (steroid).  Having surgery. This may  be needed if other treatments do not work. This usually involves correcting knee damage and removing the cyst. Follow these instructions at home:  Activity  Rest as told by your health care provider.  Avoid activities that make pain or swelling worse.  Return to your normal activities as told by your health care provider. Ask your health care provider what activities are safe for you.  Do not use the injured limb to support your body weight until your health care provider says that you can. Use crutches as told by your health care provider. General  instructions  Take over-the-counter and prescription medicines only as told by your health care provider.  Keep all follow-up visits as told by your health care provider. This is important. Contact a health care provider if:  You have knee pain, stiffness, or swelling that does not get better. Get help right away if:  You have sudden or worsening pain and swelling in your calf area. Summary  A Baker cyst, also called a popliteal cyst, is a growth that forms at the back of the knee.  In most cases, a Baker cyst results from another knee problem that causes swelling inside the knee.  A Baker cyst that is not painful may go away without treatment.  If needed, treatment for a Baker cyst may include resting, keeping weight off of the knee, medicines, or draining fluid from the cyst.  Surgery may be needed if other treatments are not effective. This information is not intended to replace advice given to you by your health care provider. Make sure you discuss any questions you have with your health care provider. Document Revised: 11/25/2018 Document Reviewed: 11/25/2018 Elsevier Patient Education  [place].

## 2020-05-13 NOTE — Progress Notes (Signed)
Subjective:    CC: L knee pain and swelling  I, Molly Weber, LAT, ATC, am serving as scribe for Dr. Lynne Leader.  HPI: Pt is a 69 y/o female presenting w/ c/o L knee pain and swelling x approximately one month w/ no known MOI.  She locates her pain to her L ant and post knee.  She has been playing pickle ball for about 6 months but denies any other increase in activity.  She denies any specific increase in activity or injury a month ago when the pain started.  L knee swelling: yes in her L post knee L knee mechanical symptoms: yes Aggravating factors: after activity I.e. pickleball; squatting Treatments tried: IBU; Lidocaine patches  Diagnostic testing: L LE venous doppler US- 05/03/20  Pertinent review of Systems: No fevers or chills  Relevant historical information: History of left mastectomy   Objective:    Vitals:   05/13/20 1553  BP: 110/62  Pulse: 88  SpO2: 96%   General: Well Developed, well nourished, and in no acute distress.   MSK: Left knee mild effusion otherwise normal-appearing Normal motion with crepitation. Mildly tender palpation medial joint line and posterior knee. Small mass palpated posterior knee consistent with Baker's cyst. Stable ligamentous exam. Intact strength.  Lab and Radiology Results  Procedure: Real-time Ultrasound Guided aspiration and injection of left knee Baker's cyst Device: Philips Affiniti 50G Images permanently stored and available for review in PACS Ultrasound examination of knee prior to aspiration injection reveals moderate effusion.  Medial compartment DJD, and a large Baker's cyst that is loculated with 2 main cyst components. Verbal informed consent obtained.  Discussed risks and benefits of procedure. Warned about infection bleeding damage to structures skin hypopigmentation and fat atrophy among others. Patient expresses understanding and agreement Time-out conducted.   Noted no overlying erythema, induration, or  other signs of local infection.   Skin prepped in a sterile fashion.   Local anesthesia: Topical Ethyl chloride.   With sterile technique and under real time ultrasound guidance:   3 mL of lidocaine injected subcutaneously and along planned aspiration tract achieving good anesthesia. The skin was again sterilized with isopropyl alcohol. 18-gauge needle was used to access the deep and superficial cyst with 1 puncture.  15 mL of clear yellow fluid was aspirated.  Both cysts were seen decompressed following this. Syringe was exchanged and 40 mg of Kenalog and 2 mL of Marcaine were injected.   Completed without difficulty   Pain immediately resolved suggesting accurate placement of the medication.   Advised to call if fevers/chills, erythema, induration, drainage, or persistent bleeding.   Images permanently stored and available for review in the ultrasound unit.  Impression: Technically successful ultrasound guided injection.      Impression and Recommendations:    Assessment and Plan: 69 y.o. female with left knee pain and swelling.  Patient has a Baker's cyst and small effusion and medial compartment DJD on examination today.  The Baker's cyst was aspirated decompressed and injected with steroid.  Plan for compression and Voltaren gel and activity as tolerated.  If symptoms return we will proceed with further evaluation and management..   Orders Placed This Encounter  Procedures  . [place] LIMITED JOINT SPACE STRUCTURES LOW LEFT(NO LINKED CHARGES)    Order Specific Question:   Reason for Exam (SYMPTOM  OR DIAGNOSIS REQUIRED)    Answer:   L knee pain    Order Specific Question:   Preferred imaging location?    Answer:  [facility]   No orders of the defined types were placed in this encounter.   Discussed warning signs or symptoms. Please see discharge instructions. Patient expresses understanding.   The above documentation has been reviewed and is accurate and  complete Lynne Leader, M.D.

## 2020-05-13 NOTE — Telephone Encounter (Signed)
Appointment scheduled. Patient is coming in this afternoon.

## 2020-05-14 ENCOUNTER — Telehealth: Payer: Self-pay | Admitting: *Deleted

## 2020-05-14 DIAGNOSIS — M25562 Pain in left knee: Secondary | ICD-10-CM

## 2020-05-14 DIAGNOSIS — M25462 Effusion, left knee: Secondary | ICD-10-CM

## 2020-05-14 NOTE — Telephone Encounter (Signed)
Referral placed.

## 2020-05-14 NOTE — Telephone Encounter (Signed)
-----   Message from Erline Hau, MD sent at 05/04/2020  8:54 AM EDT ----- Regarding: FW: LT LEG VENOUS As suspected she has a left Baker's cyst. Let's refer her to sports medicine for aspiration.  Thanks, [facility] ----- Message ----- From: Cira Servant, RVT Sent: 05/03/2020   1:31 PM EDT To: Erline Hau, MD Subject: LT LEG VENOUS                                  Hi,  There is no DVT in left leg.    Suprapatellar fluid and fluid around the medial meniscus seen.   Also, a 5.3 x 1.5 x 3.5 cm Baker's cyst.   She does state that she has pain at both medial and lateral meniscus locations.   I let her know the results and sent her home.   Thanks, Salvadore Dom, RVT, RDCS, RDMS [facility] [employer]

## 2020-05-15 ENCOUNTER — Other Ambulatory Visit: Payer: Self-pay | Admitting: Internal Medicine

## 2020-05-15 DIAGNOSIS — E559 Vitamin D deficiency, unspecified: Secondary | ICD-10-CM

## 2020-05-15 MED ORDER — VITAMIN D (ERGOCALCIFEROL) 1.25 MG (50000 UNIT) PO CAPS: 50000.0000 [IU] | ORAL_CAPSULE | ORAL | 0 refills | Status: AC

## 2020-05-15 NOTE — Telephone Encounter (Signed)
Patient is aware See lab result note 

## 2020-05-15 NOTE — Telephone Encounter (Signed)
Patient returned Rachel's call.   Please call [phone]

## 2020-06-25 NOTE — Progress Notes (Signed)
I, Wendy Poet, LAT, ATC, am serving as scribe for Dr. Lynne Leader.  Chelsea Beasley is a 69 y.o. female who presents to [place] at [facility] today for f/u of L knee pain and swelling.  She was last seen by Dr. Georgina Snell on 05/13/20 and had a L knee aspiration/injection.  She was advised to use a knee compression sleeve. Of note, the pt enjoys playing pickleball.  Since her last visit w/ Dr. Georgina Snell, pt reports that her L knee pain and swelling is worsening.  The injection helped for a 1-2 days and then pain returned.  She locates the majority of her pain to her L post-med knee.  She also notes that she has not been playing pickleball due to the pain.  Pertinent review of systems: No fevers or chills  Relevant historical information: History of left mastectomy   Exam:  BP (!) 142/78 (BP Location: Right Arm, Patient Position: Sitting, Cuff Size: Normal)   Pulse 70   Ht 5\' 1"  (1.549 m)   Wt 134 lb 9.6 oz (61.1 kg)   LMP 01/27/2005 (Approximate)   SpO2 97%   BMI 25.43 kg/m  General: Well Developed, well nourished, and in no acute distress.   MSK: Left knee moderate effusion otherwise normal-appearing Normal motion with crepitation.  Tender palpation medial joint line. Stable ligamentous exam.    Lab and Radiology Results  Procedure: Real-time Ultrasound Guided Injection of right knee superior lateral patellar space Device: Philips Affiniti 50G Images permanently stored and available for review in PACS Ultrasound examination of knee prior to injection reveals moderate effusion.  Narrowed medial joint line with partially extruded medial meniscus. Baker's cyst has returned moderate in size. Verbal informed consent obtained.  Discussed risks and benefits of procedure. Warned about infection bleeding damage to structures skin hypopigmentation and fat atrophy among others. Patient expresses understanding and agreement Time-out conducted.   Noted no overlying erythema,  induration, or other signs of local infection.   Skin prepped in a sterile fashion.   Local anesthesia: Topical Ethyl chloride.   With sterile technique and under real time ultrasound guidance:  40 mg of Kenalog and 2 mL of Marcaine injected into knee. Fluid seen entering the joint capsule.   Completed without difficulty   Pain immediately resolved suggesting accurate placement of the medication.   Advised to call if fevers/chills, erythema, induration, drainage, or persistent bleeding.   Images permanently stored and available for review in the ultrasound unit.  Impression: Technically successful ultrasound guided injection.   X-ray images right knee obtained today personally and independently interpreted Moderate DJD medial compartment.  Mild DJD patellofemoral compartment.  No fractures. Await formal radiology review  Assessment and Plan: 69 y.o. female with right knee pain and effusion.  Pain recurrent after last visit 6 weeks ago.  At last visit patient had a large Baker's cyst that was aspirated and injected.  This did not provide significant pain relief.  Discussed options today.  Plan for intra-articular knee injection.  If not improved would recommend MRI to further characterize cause of pain and for potential surgical planning.  In the interim continue Voltaren gel and compression.   PDMP not reviewed this encounter. Orders Placed This Encounter  Procedures  . DG Knee AP/LAT W/[place] Left    Standing Status:   Future    Number of Occurrences:   1    Standing Expiration Date:   07/27/2020    Order Specific Question:   Reason for  Exam (SYMPTOM  OR DIAGNOSIS REQUIRED)    Answer:   L knee pain    Order Specific Question:   Preferred imaging location?    Answer:   [facility]  . [place] LIMITED JOINT SPACE STRUCTURES LOW LEFT(NO LINKED CHARGES)    Order Specific Question:   Reason for Exam (SYMPTOM  OR DIAGNOSIS REQUIRED)    Answer:   L knee pain    Order Specific Question:    Preferred imaging location?    Answer:   [facility]   No orders of the defined types were placed in this encounter.    Discussed warning signs or symptoms. Please see discharge instructions. Patient expresses understanding.   The above documentation has been reviewed and is accurate and complete Lynne Leader, M.D.

## 2020-06-26 ENCOUNTER — Ambulatory Visit: Payer: Self-pay

## 2020-06-26 ENCOUNTER — Encounter: Payer: Self-pay | Admitting: Family Medicine

## 2020-06-26 ENCOUNTER — Ambulatory Visit: Payer: Medicare HMO | Admitting: Family Medicine

## 2020-06-26 ENCOUNTER — Ambulatory Visit (INDEPENDENT_AMBULATORY_CARE_PROVIDER_SITE_OTHER): Payer: Medicare HMO

## 2020-06-26 ENCOUNTER — Other Ambulatory Visit: Payer: Self-pay

## 2020-06-26 VITALS — BP 142/78 | HR 70 | Ht 61.0 in | Wt 134.6 lb

## 2020-06-26 DIAGNOSIS — M25562 Pain in left knee: Secondary | ICD-10-CM | POA: Diagnosis not present

## 2020-06-26 DIAGNOSIS — M7122 Synovial cyst of popliteal space [Baker], left knee: Secondary | ICD-10-CM | POA: Diagnosis not present

## 2020-06-26 NOTE — Patient Instructions (Signed)
Thank you for coming in today.  Call or go to the ER if you develop a large red swollen joint with extreme pain or oozing puss.   If not improved let me know. Next step is MRI.   Please use voltaren gel up to 4x daily for pain as needed.

## 2020-06-27 NOTE — Progress Notes (Signed)
X-ray left knee shows mild arthritis.

## 2020-07-08 ENCOUNTER — Telehealth: Payer: Self-pay | Admitting: Family Medicine

## 2020-07-08 NOTE — Telephone Encounter (Signed)
Patient called asking if the MRI could be ordered for her that Dr Georgina Snell discussed.

## 2020-07-09 ENCOUNTER — Other Ambulatory Visit: Payer: Self-pay

## 2020-07-09 DIAGNOSIS — G8929 Other chronic pain: Secondary | ICD-10-CM

## 2020-07-09 NOTE — Telephone Encounter (Signed)
Spoke w/ pt this morning and she reports that her knee is not doing any better and she having to "drag the leg along." I moved forward per Dr. Clovis Riley assessment plan and ordered an MRI for her L knee at [facility].

## 2020-07-20 DIAGNOSIS — U071 COVID-19: Secondary | ICD-10-CM | POA: Diagnosis not present

## 2020-08-03 ENCOUNTER — Other Ambulatory Visit: Payer: Self-pay

## 2020-08-03 ENCOUNTER — Ambulatory Visit
Admission: RE | Admit: 2020-08-03 | Discharge: 2020-08-03 | Disposition: A | Discharge status: Home / Self Care | Payer: Medicare HMO | Source: Ambulatory Visit | Attending: Family Medicine | Admitting: Family Medicine

## 2020-08-03 DIAGNOSIS — M25562 Pain in left knee: Secondary | ICD-10-CM | POA: Diagnosis not present

## 2020-08-03 DIAGNOSIS — G8929 Other chronic pain: Secondary | ICD-10-CM

## 2020-08-05 NOTE — Progress Notes (Signed)
MRI of the knee shows a medial meniscus tear and medium arthritis.  Additionally there is a large Baker's cyst.  Recommend return to clinic to discuss results and treatment plan and options.

## 2020-08-07 NOTE — Progress Notes (Signed)
I, Wendy Poet, LAT, ATC, am serving as scribe for Dr. Lynne Leader.  Chelsea Beasley is a 70 y.o. female who presents to [place] at [facility] today for f/u of L knee pain and L knee MRI review.  She was last seen by Dr. Georgina Snell on 06/26/20 and noted worsening pain in her L knee, particularly along the post-med aspect of her knee.  She had a L knee injection at her last visit and had a prior L knee aspiration/injection on 05/13/20 that provided only a few days of relief.  Since her last visit, pt reports that her L knee is about the same, maybe slightly improved, but she is also not doing as much physical activity (walking and pickleball).  She reports a burning pain in her knee when it's most severe.  Diagnostic testing: L knee MRI- 08/03/20; L knee XR- 06/26/20   Pertinent review of systems: No fevers or chills  Relevant historical information: History of mastectomy   Exam:  BP (!) 158/94 (BP Location: Right Arm, Patient Position: Sitting, Cuff Size: Normal)   Pulse 70   Ht 5\' 1"  (1.549 m)   Wt 136 lb 9.6 oz (62 kg)   LMP 01/27/2005 (Approximate)   SpO2 99%   BMI 25.81 kg/m  General: Well Developed, well nourished, and in no acute distress.   MSK: Left knee swelling at medial and posterior aspect of knee. Normal motion. Mildly tender to palpation medial joint line.    Lab and Radiology Results EXAM: MRI OF THE LEFT KNEE WITHOUT CONTRAST  TECHNIQUE: Multiplanar, multisequence MR imaging of the knee was performed. No intravenous contrast was administered.  COMPARISON:  Left knee x-rays dated June 26, 2020.  FINDINGS: MENISCI  Medial meniscus: Horizontal tear of the posterior horn extending to the body junction. Radial tear of the mid body.  Lateral meniscus:  Intact.  LIGAMENTS  Cruciates:  Intact ACL and PCL.  Collaterals: Medial collateral ligament is intact. Lateral collateral ligament complex is  intact.  CARTILAGE  Patellofemoral: High-grade partial and full-thickness cartilage loss over the medial trochlea and trochlear groove.  Medial: Large areas of near full-thickness cartilage loss over the central weight-bearing medial femoral condyle and medial tibial plateau.  Lateral:  Mild partial-thickness cartilage loss.  Joint:  Small to moderate joint effusion.  Popliteal Fossa: Large complex Baker cyst extending inferiorly along the medial gastrocnemius muscle. Intact popliteus tendon.  Extensor Mechanism: Intact quadriceps tendon and patellar tendon. Intact medial and lateral patellar retinaculum. Intact MPFL.  Bones: No acute fracture or dislocation. No suspicious bone lesion. Small tricompartmental marginal osteophytes.  Other: None.  IMPRESSION: 1. Horizontal tear of the medial meniscus posterior horn extending to the body junction. Additional radial tear of the mid body. 2. Tricompartmental osteoarthritis, moderate in the medial compartment. 3. Small to moderate joint effusion. Large complex Baker cyst.   Electronically Signed   By: Titus Dubin M.D.   On: 08/03/2020 13:39  I, Lynne Leader, personally (independently) visualized and performed the interpretation of the images attached in this note.      Assessment and Plan: 70 y.o. female with left knee pain thought to be due to degenerative changes.  However she is having a lot of discomfort thought to be due to the Baker's cyst.  This is failing to improve with typical conservative management including aspiration and injection attempts along with compression.  I do think that she is a potentially good surgical candidate for excision of the Baker's cyst  and have referred her to orthopedic surgery for surgical consultation.  I did explain that it is likely she is will require total knee replacement at some point in future but if the Baker's cyst were removed and she is able to move her knee more  easily she will probably have less pain and more functionality and be able to delay a knee replacement by least a few years.   PDMP not reviewed this encounter. Orders Placed This Encounter  Procedures  . Ambulatory referral to Orthopedic Surgery    Referral Priority:   Routine    Referral Type:   Surgical    Referral Reason:   Specialty Services Required    Requested Specialty:   Orthopedic Surgery    Number of Visits Requested:   1   No orders of the defined types were placed in this encounter.    Discussed warning signs or symptoms. Please see discharge instructions. Patient expresses understanding.   The above documentation has been reviewed and is accurate and complete Lynne Leader, M.D.  Total encounter time 20 minutes including face-to-face time with the patient and, reviewing past medical record, and charting on the date of service.   MRI findings treatment plan and options.

## 2020-08-08 ENCOUNTER — Encounter: Payer: Self-pay | Admitting: Family Medicine

## 2020-08-08 ENCOUNTER — Other Ambulatory Visit: Payer: Self-pay

## 2020-08-08 ENCOUNTER — Ambulatory Visit: Payer: Medicare HMO | Admitting: Family Medicine

## 2020-08-08 VITALS — BP 158/94 | HR 70 | Ht 61.0 in | Wt 136.6 lb

## 2020-08-08 DIAGNOSIS — S83242A Other tear of medial meniscus, current injury, left knee, initial encounter: Secondary | ICD-10-CM | POA: Diagnosis not present

## 2020-08-08 DIAGNOSIS — M7122 Synovial cyst of popliteal space [Baker], left knee: Secondary | ICD-10-CM | POA: Diagnosis not present

## 2020-08-08 NOTE — Patient Instructions (Signed)
Thank you for coming in today.  You should hear from orthopedics soon about surgical options.   Let me know if you have a problem.   I can do more injections if needed but I dont think that will work.

## 2020-08-20 ENCOUNTER — Encounter: Payer: Self-pay | Admitting: Orthopaedic Surgery

## 2020-08-20 ENCOUNTER — Other Ambulatory Visit: Payer: Self-pay | Admitting: Physician Assistant

## 2020-08-20 ENCOUNTER — Telehealth: Payer: Self-pay

## 2020-08-20 ENCOUNTER — Ambulatory Visit (INDEPENDENT_AMBULATORY_CARE_PROVIDER_SITE_OTHER): Payer: Medicare HMO | Admitting: Orthopaedic Surgery

## 2020-08-20 ENCOUNTER — Other Ambulatory Visit: Payer: Self-pay

## 2020-08-20 VITALS — Ht 62.0 in | Wt 134.2 lb

## 2020-08-20 DIAGNOSIS — M1712 Unilateral primary osteoarthritis, left knee: Secondary | ICD-10-CM | POA: Diagnosis not present

## 2020-08-20 MED ORDER — IBUPROFEN 800 MG PO TABS: 800.0000 mg | ORAL_TABLET | Freq: Three times a day (TID) | ORAL | 2 refills | Status: DC | PRN

## 2020-08-20 NOTE — Telephone Encounter (Signed)
Noted  

## 2020-08-20 NOTE — Telephone Encounter (Signed)
Please submit for gel injection left knee- Dr Erlinda Hong.

## 2020-08-20 NOTE — Progress Notes (Signed)
Office Visit Note   Patient: Chelsea Beasley           Date of Birth: November 22, 1950           MRN: [id number] Visit Date: 08/20/2020              Requested by: Gregor Hams, MD [place],  [place] [id number] PCP: Isaac Bliss, Rayford Halsted, MD   Assessment & Plan: Visit Diagnoses:  1. Unilateral primary osteoarthritis, left knee     Plan: Impression is advanced degenerative joint disease left knee.  MRI reviewed in detail with the patient. Symptoms consistent with DJD and not so much from the meniscus tear.  We did discuss viscosupplementation injections as well as total knee arthroplasty.  She would like to try a course of viscosupplementation injections.  We have submitted approval for this.  Have also provided her with a total knee replacement handout.  She will follow up with [place] once approved for gel injection.  Call with concerns or questions.  This patient is diagnosed with osteoarthritis of the knee(s).    Radiographs show evidence of joint space narrowing, osteophytes, subchondral sclerosis and/or subchondral cysts.  This patient has knee pain which interferes with functional and activities of daily living.    This patient has experienced inadequate response, adverse effects and/or intolerance with conservative treatments such as acetaminophen, NSAIDS, topical creams, physical therapy or regular exercise, knee bracing and/or weight loss.   This patient has experienced inadequate response or has a contraindication to intra articular steroid injections for at least 3 months.   This patient is not scheduled to have a total knee replacement within 6 months of starting treatment with viscosupplementation.  Follow-Up Instructions: Return for once approved for left knee visco inj.   Orders:  No orders of the defined types were placed in this encounter.  Meds ordered this encounter  Medications  . ibuprofen (ADVIL) 800 MG tablet    Sig: Take 1 tablet (800 mg total) by  mouth 3 (three) times daily as needed.    Dispense:  60 tablet    Refill:  2      Procedures: No procedures performed   Clinical Data: No additional findings.   Subjective: Chief Complaint  Patient presents with  . Left Knee - Pain    HPI patient is a pleasant 70 year old female who comes in today with left knee pain.  She has been dealing with this for the past 3 months.  No specific injury but she does note that she retired and started playing pickle ball twice a week as opposed to her regularly scheduled games once a week.  The pain she has is described as a constant ache worse with playing pickle ball as well as with any ambulation.  She has associated swelling, instability, locking catching.  She takes an occasional ibuprofen with mild relief of symptoms.  She has had 2 previous cortisone injections without significant relief of symptoms.  Recent MRI shows a medial meniscus tear as well as advanced tricompartmental degenerative changes worse in the medial and patellofemoral compartments.  Review of Systems was detailed in HPI.  All others reviewed and are negative.   Objective: Vital Signs: Ht 5\' 2"  (1.575 m)   Wt 134 lb 3.2 oz (60.9 kg)   LMP 01/27/2005 (Approximate)   BMI 24.55 kg/m   Physical Exam well-developed well-nourished female no acute distress.  Alert oriented x3.  Ortho Exam left knee exam shows a small  effusion.  Range of motion from 0 to 115 degrees with pain.  Medial joint line tenderness.  Moderate patellofemoral crepitus.  Ligaments are stable.  She is neurovascular intact distally.  Specialty Comments:  No specialty comments available.  Imaging: No new imaging   PMFS History: Patient Active Problem List   Diagnosis Date Noted  . Vitamin D deficiency 01/13/2019  . Routine general medical examination at a health care facility 12/23/2017  . Chest pain 12/23/2017  . Dryness of vagina 11/13/2013  . 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 Anemia 09/24/2008   . MASTECTOMY, LEFT, HX OF 09/24/2007   Past Medical History:  Diagnosis Date  . Anemia   . Cancer [facility])    breast  . Carpal tunnel syndrome on right   . Heart murmur     Family History  Problem Relation Age of Onset  . Hypertension Mother   . Cancer Mother        lung  . Hypertension Sister   . Colon cancer Neg Hx   . Esophageal cancer Neg Hx   . Stomach cancer Neg Hx   . Rectal cancer Neg Hx     Past Surgical History:  Procedure Laterality Date  . APPENDECTOMY    . BREAST BIOPSY Left 05/20/2005  . BREAST SURGERY     left, total, 2006, cancer, no reconstruction  . CARPAL TUNNEL RELEASE Right   . MASTECTOMY Left 06/04/2005  . NOSE SURGERY     Social History   Occupational History    Employer: [employer]    Comment: IT sales professional  Tobacco Use  . Smoking status: Never Smoker  . Smokeless tobacco: Never Used  Substance and Sexual Activity  . Alcohol use: Yes    Comment: Occasional beer  . Drug use: No  . Sexual activity: Not on file

## 2020-09-06 ENCOUNTER — Telehealth: Payer: Self-pay

## 2020-09-06 NOTE — Telephone Encounter (Signed)
Submitted VOB for Monovisc, left knee. Pending BV

## 2020-09-10 ENCOUNTER — Telehealth: Payer: Self-pay | Admitting: Orthopaedic Surgery

## 2020-09-10 ENCOUNTER — Telehealth: Payer: Self-pay

## 2020-09-10 NOTE — Telephone Encounter (Signed)
Called and left a VM advising patient the status of gel injection.

## 2020-09-10 NOTE — Telephone Encounter (Signed)
PA required for Monovisc, left knee. Submitted PA online through [place]. Pending PA# [id number]

## 2020-09-10 NOTE — Telephone Encounter (Signed)
Pt called wanting to know how long it may take to find out if she was approved for the gel injections? She would like a CB to discuss  [phone]

## 2020-09-19 ENCOUNTER — Telehealth: Payer: Self-pay

## 2020-09-19 NOTE — Telephone Encounter (Signed)
Approved for Monovisc, left knee. [place] Patient will be responsible for 20% OOP. Co-pay of $20.00 PA Approval# [id number] Valid 09/10/2020- 03/10/2021  Appt.09/24/2020 with Dr. Erlinda Hong

## 2020-09-24 ENCOUNTER — Encounter: Payer: Self-pay | Admitting: Orthopaedic Surgery

## 2020-09-24 ENCOUNTER — Ambulatory Visit: Payer: Medicare HMO | Admitting: Orthopaedic Surgery

## 2020-09-24 VITALS — Ht 62.0 in | Wt 134.0 lb

## 2020-09-24 DIAGNOSIS — M1712 Unilateral primary osteoarthritis, left knee: Secondary | ICD-10-CM | POA: Diagnosis not present

## 2020-09-24 MED ORDER — HYALURONAN 88 MG/4ML IX SOSY
88.0000 mg | PREFILLED_SYRINGE | INTRA_ARTICULAR | Status: AC | PRN
  Administered 2020-09-24: 88 mg via INTRA_ARTICULAR

## 2020-09-24 MED ORDER — LIDOCAINE HCL 1 % IJ SOLN
5.0000 mL | INTRAMUSCULAR | Status: AC | PRN
  Administered 2020-09-24: 5 mL

## 2020-09-24 NOTE — Progress Notes (Signed)
   Office Visit Note   Patient: Chelsea Beasley           Date of Birth: July 16, 1951           MRN: [id number] Visit Date: 09/24/2020              Requested by: Chelsea Beasley, Chelsea Halsted, MD [place],  [place] [id number] PCP: Chelsea Beasley, Chelsea Halsted, MD   Assessment & Plan: Visit Diagnoses:  1. Unilateral primary osteoarthritis, left knee     Plan: I aspirated 7 cc of joint fluid prior to injecting Monovisc today.  Compression wrap applied.  Patient will follow-up as needed.  Follow-Up Instructions: No follow-ups on file.   Orders:  No orders of the defined types were placed in this encounter.  No orders of the defined types were placed in this encounter.     Procedures: Large Joint Inj: L knee on 09/24/2020 10:30 AM Indications: pain Details: 22 G needle  Arthrogram: No  Medications: 88 mg Hyaluronan 88 MG/4ML; 5 mL lidocaine 1 % Outcome: tolerated well, no immediate complications Patient was prepped and draped in the usual sterile fashion.       Clinical Data: No additional findings.   Subjective: Chief Complaint  Patient presents with  . Left Knee - Follow-up    Monovisc    Chelsea Beasley is here for first Monovisc injection for the left knee.   Review of Systems   Objective: Vital Signs: Ht 5\' 2"  (1.575 m)   Wt 134 lb (60.8 kg)   LMP 01/27/2005 (Approximate)   BMI 24.51 kg/m   Physical Exam  Ortho Exam Small joint effusion of the left knee. Specialty Comments:  No specialty comments available.  Imaging: No results found.   PMFS History: Patient Active Problem List   Diagnosis Date Noted  . Vitamin D deficiency 01/13/2019  . Routine general medical examination at a health care facility 12/23/2017  . Chest pain 12/23/2017  . Dryness of vagina 11/13/2013  . 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 Anemia 09/24/2008  . MASTECTOMY, LEFT, HX OF 09/24/2007   Past Medical History:  Diagnosis Date  . Anemia   . Cancer [facility])     breast  . Carpal tunnel syndrome on right   . Heart murmur     Family History  Problem Relation Age of Onset  . Hypertension Mother   . Cancer Mother        lung  . Hypertension Sister   . Colon cancer Neg Hx   . Esophageal cancer Neg Hx   . Stomach cancer Neg Hx   . Rectal cancer Neg Hx     Past Surgical History:  Procedure Laterality Date  . APPENDECTOMY    . BREAST BIOPSY Left 05/20/2005  . BREAST SURGERY     left, total, 2006, cancer, no reconstruction  . CARPAL TUNNEL RELEASE Right   . MASTECTOMY Left 06/04/2005  . NOSE SURGERY     Social History   Occupational History    Employer: [employer]    Comment: IT sales professional  Tobacco Use  . Smoking status: Never Smoker  . Smokeless tobacco: Never Used  Substance and Sexual Activity  . Alcohol use: Yes    Comment: Occasional beer  . Drug use: No  . Sexual activity: Not on file

## 2020-10-25 DIAGNOSIS — L80 Vitiligo: Secondary | ICD-10-CM | POA: Diagnosis not present

## 2020-10-28 ENCOUNTER — Other Ambulatory Visit: Payer: Self-pay

## 2020-10-29 ENCOUNTER — Encounter: Payer: Self-pay | Admitting: Internal Medicine

## 2020-10-29 ENCOUNTER — Ambulatory Visit (INDEPENDENT_AMBULATORY_CARE_PROVIDER_SITE_OTHER): Payer: Medicare HMO | Admitting: Internal Medicine

## 2020-10-29 VITALS — BP 140/80 | HR 62 | Temp 98.7°F | Wt 134.9 lb

## 2020-10-29 DIAGNOSIS — K921 Melena: Secondary | ICD-10-CM

## 2020-10-29 LAB — CBC WITH DIFFERENTIAL/PLATELET
Basophils Absolute: 0 10*3/uL (ref 0.0–0.1)
Basophils Relative: 0.5 % (ref 0.0–3.0)
Eosinophils Absolute: 0 10*3/uL (ref 0.0–0.7)
Eosinophils Relative: 0.6 % (ref 0.0–5.0)
HCT: 34.5 % — ABNORMAL LOW (ref 36.0–46.0)
Hemoglobin: 11.3 g/dL — ABNORMAL LOW (ref 12.0–15.0)
Lymphocytes Relative: 23.9 % (ref 12.0–46.0)
Lymphs Abs: 1.6 10*3/uL (ref 0.7–4.0)
MCHC: 32.8 g/dL (ref 30.0–36.0)
MCV: 88.1 fl (ref 78.0–100.0)
Monocytes Absolute: 0.5 10*3/uL (ref 0.1–1.0)
Monocytes Relative: 7.7 % (ref 3.0–12.0)
Neutro Abs: 4.4 10*3/uL (ref 1.4–7.7)
Neutrophils Relative %: 67.3 % (ref 43.0–77.0)
Platelets: 221 10*3/uL (ref 150.0–400.0)
RBC: 3.91 Mil/uL (ref 3.87–5.11)
RDW: 13 % (ref 11.5–15.5)
WBC: 6.6 10*3/uL (ref 4.0–10.5)

## 2020-10-29 MED ORDER — HYDROCORTISONE (PERIANAL) 1 % EX CREA: TOPICAL_CREAM | CUTANEOUS | 1 refills | Status: DC

## 2020-10-29 NOTE — Progress Notes (Signed)
Acute office Visit     This visit occurred during the SARS-CoV-2 public health emergency.  Safety protocols were in place, including screening questions prior to the visit, additional usage of staff PPE, and extensive cleaning of exam room while observing appropriate contact time as indicated for disinfecting solutions.    CC/Reason for Visit: Blood in stool  HPI: Chelsea Beasley is a 70 y.o. female who is coming in today for the above mentioned reasons. Past Medical History is significant for: Prior history of breast cancer but she is in good health.  She had a colonoscopy in 2019 that showed internal hemorrhoids and an adematous polyp that was removed.  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  She started tacrolimus cream prescribed by her dermatologist around her mouth.  This was on Saturday.  The next day she started noticing bright red blood in the toilet.  She feels like blood is dripping into the toilet.  It is painless.  She has no abdominal pain, no fever, her stools have been soft although not quite diarrhea.   Past Medical/Surgical History: Past Medical History:  Diagnosis Date  . Anemia   . Cancer [facility])    breast  . Carpal tunnel syndrome on right   . Heart murmur     Past Surgical History:  Procedure Laterality Date  . APPENDECTOMY    . BREAST BIOPSY Left 05/20/2005  . BREAST SURGERY     left, total, 2006, cancer, no reconstruction  . CARPAL TUNNEL RELEASE Right   . MASTECTOMY Left 06/04/2005  . NOSE SURGERY      Social History:  reports that she has never smoked. She has never used smokeless tobacco. She reports current alcohol use. She reports that she does not use drugs.  Allergies: No Known Allergies  Family History:  Family History  Problem Relation Age of Onset  . Hypertension Mother   . Cancer Mother        lung  . Hypertension Sister   . Colon cancer Neg Hx   . Esophageal cancer Neg Hx   . Stomach cancer Neg Hx   . Rectal  cancer Neg Hx      Current Outpatient Medications:  .  Hydrocortisone, Perianal, (PROCTO-PAK) 1 % CREA, Apply twice daily, Disp: 28 g, Rfl: 1 .  ibuprofen (ADVIL) 800 MG tablet, Take 1 tablet (800 mg total) by mouth 3 (three) times daily as needed., Disp: 60 tablet, Rfl: 2 .  Multiple Vitamins-Calcium (ONE-A-DAY WOMENS PO), Take by mouth daily., Disp: , Rfl:  .  tacrolimus (PROTOPIC) 0.1 % ointment, Apply to lips twice daily, Disp: , Rfl:   Review of Systems:  Constitutional: Denies fever, chills, diaphoresis, appetite change and fatigue.  HEENT: Denies photophobia, eye pain, redness, hearing loss, ear pain, congestion, sore throat, rhinorrhea, sneezing, mouth sores, trouble swallowing, neck pain, neck stiffness and tinnitus.   Respiratory: Denies SOB, DOE, cough, chest tightness,  and wheezing.   Cardiovascular: Denies chest pain, palpitations and leg swelling.  Gastrointestinal: Denies nausea, vomiting, abdominal pain, diarrhea, constipation and abdominal distention.  Genitourinary: Denies dysuria, urgency, frequency, hematuria, flank pain and difficulty urinating.  Endocrine: Denies: hot or cold intolerance, sweats, changes in hair or nails, polyuria, polydipsia. Musculoskeletal: Denies myalgias, back pain, joint swelling, arthralgias and gait problem.  Skin: Denies pallor, rash and wound.  Neurological: Denies dizziness, seizures, syncope, weakness, light-headedness, numbness and headaches.  Hematological: Denies adenopathy. Easy bruising, personal or family bleeding history  Psychiatric/Behavioral: Denies  suicidal ideation, mood changes, confusion, nervousness, sleep disturbance and agitation    Physical Exam: Vitals:   10/29/20 1319  BP: 140/80  Pulse: 62  Temp: 98.7 F (37.1 C)  TempSrc: Oral  SpO2: 98%  Weight: 134 lb 14.4 oz (61.2 kg)    Body mass index is 24.67 kg/m.   Constitutional: NAD, calm, comfortable Eyes: PERRL, lids and conjunctivae normal ENMT: Mucous  membranes are moist.  Respiratory: clear to auscultation bilaterally, no wheezing, no crackles. Normal respiratory effort. No accessory muscle use.  Cardiovascular: Regular rate and rhythm, no murmurs / rubs / gallops. No extremity edema.  Neurologic: Grossly intact and nonfocal Psychiatric: Normal judgment and insight. Alert and oriented x 3. Normal mood.    Impression and Plan:  Hematochezia  - Plan: CBC with Differential/Platelet, Hydrocortisone, Perianal, (PROCTO-PAK) 1 % CREA -Given prior colonoscopy with grade 1 internal hemorrhoids, this is likely the etiology. -She will take stool softeners as needed to avoid constipation, increase fluid and fiber intake. -She has been given hydrocortisone rectal cream to apply twice daily. -Check CBC today. -I do not believe there is any causation by the tacrolimus cream. -She knows to contact me in the next few days if bleeding frequency increases or fails to resolve.  Time spent: 30 minutes obtaining history and examining patient, as well as reviewing prior colonoscopy reports.   Patient Instructions   -Nice seeing you today!!  -Lab work today; will notify you once results are available.  -apply Hydrocortisone cream to rectal area twice daily.  -If frequency/quantity of blood increases, please notify me immediately.   Hemorrhoids Hemorrhoids are swollen veins in and around the rectum or anus. There are two types of hemorrhoids:  Internal hemorrhoids. These occur in the veins that are just inside the rectum. They may poke through to the outside and become irritated and painful.  External hemorrhoids. These occur in the veins that are outside the anus and can be felt as a painful swelling or hard lump near the anus. Most hemorrhoids do not cause serious problems, and they can be managed with home treatments such as diet and lifestyle changes. If home treatments do not help the symptoms, procedures can be done to shrink or remove the  hemorrhoids. What are the causes? This condition is caused by increased pressure in the anal area. This pressure may result from various things, including:  Constipation.  Straining to have a bowel movement.  Diarrhea.  Pregnancy.  Obesity.  Sitting for long periods of time.  Heavy lifting or other activity that causes you to strain.  Anal sex.  Riding a bike for a long period of time. What are the signs or symptoms? Symptoms of this condition include:  Pain.  Anal itching or irritation.  Rectal bleeding.  Leakage of stool (feces).  Anal swelling.  One or more lumps around the anus. How is this diagnosed? This condition can often be diagnosed through a visual exam. Other exams or tests may also be done, such as:  An exam that involves feeling the rectal area with a gloved hand (digital rectal exam).  An exam of the anal canal that is done using a small tube (anoscope).  A blood test, if you have lost a significant amount of blood.  A test to look inside the colon using a flexible tube with a camera on the end (sigmoidoscopy or colonoscopy). How is this treated? This condition can usually be treated at home. However, various procedures may be done if dietary  changes, lifestyle changes, and other home treatments do not help your symptoms. These procedures can help make the hemorrhoids smaller or remove them completely. Some of these procedures involve surgery, and others do not. Common procedures include:  Rubber band ligation. Rubber bands are placed at the base of the hemorrhoids to cut off their blood supply.  Sclerotherapy. Medicine is injected into the hemorrhoids to shrink them.  Infrared coagulation. A type of light energy is used to get rid of the hemorrhoids.  Hemorrhoidectomy surgery. The hemorrhoids are surgically removed, and the veins that supply them are tied off.  Stapled hemorrhoidopexy surgery. The surgeon staples the base of the hemorrhoid to  the rectal wall. Follow these instructions at home: Eating and drinking  Eat foods that have a lot of fiber in them, such as whole grains, beans, nuts, fruits, and vegetables.  Ask your health care provider about taking products that have added fiber (fiber supplements).  Reduce the amount of fat in your diet. You can do this by eating low-fat dairy products, eating less red meat, and avoiding processed foods.  Drink enough fluid to keep your urine pale yellow.   Managing pain and swelling  Take warm sitz baths for 20 minutes, 3-4 times a day to ease pain and discomfort. You may do this in a bathtub or using a portable sitz bath that fits over the toilet.  If directed, apply ice to the affected area. Using ice packs between sitz baths may be helpful. ? Put ice in a plastic bag. ? Place a towel between your skin and the bag. ? Leave the ice on for 20 minutes, 2-3 times a day.   General instructions  Take over-the-counter and prescription medicines only as told by your health care provider.  Use medicated creams or suppositories as told.  Get regular exercise. Ask your health care provider how much and what kind of exercise is best for you. In general, you should do moderate exercise for at least 30 minutes on most days of the week (150 minutes each week). This can include activities such as walking, biking, or yoga.  Go to the bathroom when you have the urge to have a bowel movement. Do not wait.  Avoid straining to have bowel movements.  Keep the anal area dry and clean. Use wet toilet paper or moist towelettes after a bowel movement.  Do not sit on the toilet for long periods of time. This increases blood pooling and pain.  Keep all follow-up visits as told by your health care provider. This is important. Contact a health care provider if you have:  Increasing pain and swelling that are not controlled by treatment or medicine.  Difficulty having a bowel movement, or you are  unable to have a bowel movement.  Pain or inflammation outside the area of the hemorrhoids. Get help right away if you have:  Uncontrolled bleeding from your rectum. Summary  Hemorrhoids are swollen veins in and around the rectum or anus.  Most hemorrhoids can be managed with home treatments such as diet and lifestyle changes.  Taking warm sitz baths can help ease pain and discomfort.  In severe cases, procedures or surgery can be done to shrink or remove the hemorrhoids. This information is not intended to replace advice given to you by your health care provider. Make sure you discuss any questions you have with your health care provider. Document Revised: 12/09/2018 Document Reviewed: 12/02/2017 Elsevier Patient Education  [place].  Hardy Harcum Hernandez Acosta, MD [facility]   

## 2020-10-29 NOTE — Patient Instructions (Signed)
-Nice seeing you today!!  -Lab work today; will notify you once results are available.  -apply Hydrocortisone cream to rectal area twice daily.  -If frequency/quantity of blood increases, please notify me immediately.   Hemorrhoids Hemorrhoids are swollen veins in and around the rectum or anus. There are two types of hemorrhoids:  Internal hemorrhoids. These occur in the veins that are just inside the rectum. They may poke through to the outside and become irritated and painful.  External hemorrhoids. These occur in the veins that are outside the anus and can be felt as a painful swelling or hard lump near the anus. Most hemorrhoids do not cause serious problems, and they can be managed with home treatments such as diet and lifestyle changes. If home treatments do not help the symptoms, procedures can be done to shrink or remove the hemorrhoids. What are the causes? This condition is caused by increased pressure in the anal area. This pressure may result from various things, including:  Constipation.  Straining to have a bowel movement.  Diarrhea.  Pregnancy.  Obesity.  Sitting for long periods of time.  Heavy lifting or other activity that causes you to strain.  Anal sex.  Riding a bike for a long period of time. What are the signs or symptoms? Symptoms of this condition include:  Pain.  Anal itching or irritation.  Rectal bleeding.  Leakage of stool (feces).  Anal swelling.  One or more lumps around the anus. How is this diagnosed? This condition can often be diagnosed through a visual exam. Other exams or tests may also be done, such as:  An exam that involves feeling the rectal area with a gloved hand (digital rectal exam).  An exam of the anal canal that is done using a small tube (anoscope).  A blood test, if you have lost a significant amount of blood.  A test to look inside the colon using a flexible tube with a camera on the end (sigmoidoscopy or  colonoscopy). How is this treated? This condition can usually be treated at home. However, various procedures may be done if dietary changes, lifestyle changes, and other home treatments do not help your symptoms. These procedures can help make the hemorrhoids smaller or remove them completely. Some of these procedures involve surgery, and others do not. Common procedures include:  Rubber band ligation. Rubber bands are placed at the base of the hemorrhoids to cut off their blood supply.  Sclerotherapy. Medicine is injected into the hemorrhoids to shrink them.  Infrared coagulation. A type of light energy is used to get rid of the hemorrhoids.  Hemorrhoidectomy surgery. The hemorrhoids are surgically removed, and the veins that supply them are tied off.  Stapled hemorrhoidopexy surgery. The surgeon staples the base of the hemorrhoid to the rectal wall. Follow these instructions at home: Eating and drinking  Eat foods that have a lot of fiber in them, such as whole grains, beans, nuts, fruits, and vegetables.  Ask your health care provider about taking products that have added fiber (fiber supplements).  Reduce the amount of fat in your diet. You can do this by eating low-fat dairy products, eating less red meat, and avoiding processed foods.  Drink enough fluid to keep your urine pale yellow.   Managing pain and swelling  Take warm sitz baths for 20 minutes, 3-4 times a day to ease pain and discomfort. You may do this in a bathtub or using a portable sitz bath that fits over the toilet.  If directed, apply ice to the affected area. Using ice packs between sitz baths may be helpful. ? Put ice in a plastic bag. ? Place a towel between your skin and the bag. ? Leave the ice on for 20 minutes, 2-3 times a day.   General instructions  Take over-the-counter and prescription medicines only as told by your health care provider.  Use medicated creams or suppositories as told.  Get regular  exercise. Ask your health care provider how much and what kind of exercise is best for you. In general, you should do moderate exercise for at least 30 minutes on most days of the week (150 minutes each week). This can include activities such as walking, biking, or yoga.  Go to the bathroom when you have the urge to have a bowel movement. Do not wait.  Avoid straining to have bowel movements.  Keep the anal area dry and clean. Use wet toilet paper or moist towelettes after a bowel movement.  Do not sit on the toilet for long periods of time. This increases blood pooling and pain.  Keep all follow-up visits as told by your health care provider. This is important. Contact a health care provider if you have:  Increasing pain and swelling that are not controlled by treatment or medicine.  Difficulty having a bowel movement, or you are unable to have a bowel movement.  Pain or inflammation outside the area of the hemorrhoids. Get help right away if you have:  Uncontrolled bleeding from your rectum. Summary  Hemorrhoids are swollen veins in and around the rectum or anus.  Most hemorrhoids can be managed with home treatments such as diet and lifestyle changes.  Taking warm sitz baths can help ease pain and discomfort.  In severe cases, procedures or surgery can be done to shrink or remove the hemorrhoids. This information is not intended to replace advice given to you by your health care provider. Make sure you discuss any questions you have with your health care provider. Document Revised: 12/09/2018 Document Reviewed: 12/02/2017 Elsevier Patient Education  [place].

## 2020-12-09 ENCOUNTER — Other Ambulatory Visit: Payer: Self-pay | Admitting: Internal Medicine

## 2020-12-09 DIAGNOSIS — Z1231 Encounter for screening mammogram for malignant neoplasm of breast: Secondary | ICD-10-CM

## 2020-12-31 DIAGNOSIS — L80 Vitiligo: Secondary | ICD-10-CM | POA: Diagnosis not present

## 2021-01-28 ENCOUNTER — Ambulatory Visit
Admission: RE | Admit: 2021-01-28 | Discharge: 2021-01-28 | Disposition: A | Discharge status: Home / Self Care | Payer: Medicare HMO | Source: Ambulatory Visit | Attending: Internal Medicine | Admitting: Internal Medicine

## 2021-01-28 ENCOUNTER — Other Ambulatory Visit: Payer: Self-pay

## 2021-01-28 DIAGNOSIS — Z1231 Encounter for screening mammogram for malignant neoplasm of breast: Secondary | ICD-10-CM | POA: Diagnosis not present

## 2021-02-11 DIAGNOSIS — L80 Vitiligo: Secondary | ICD-10-CM | POA: Diagnosis not present

## 2021-03-25 DIAGNOSIS — L80 Vitiligo: Secondary | ICD-10-CM | POA: Diagnosis not present

## 2021-04-15 ENCOUNTER — Other Ambulatory Visit: Payer: Self-pay

## 2021-04-15 ENCOUNTER — Encounter: Payer: Self-pay | Admitting: Internal Medicine

## 2021-04-15 ENCOUNTER — Ambulatory Visit (INDEPENDENT_AMBULATORY_CARE_PROVIDER_SITE_OTHER): Payer: Medicare HMO | Admitting: Internal Medicine

## 2021-04-15 VITALS — BP 124/80 | HR 62 | Temp 98.8°F | Wt 131.8 lb

## 2021-04-15 DIAGNOSIS — H1132 Conjunctival hemorrhage, left eye: Secondary | ICD-10-CM | POA: Diagnosis not present

## 2021-04-15 DIAGNOSIS — Z111 Encounter for screening for respiratory tuberculosis: Secondary | ICD-10-CM

## 2021-04-15 DIAGNOSIS — M25562 Pain in left knee: Secondary | ICD-10-CM | POA: Diagnosis not present

## 2021-04-15 DIAGNOSIS — G8929 Other chronic pain: Secondary | ICD-10-CM | POA: Diagnosis not present

## 2021-04-15 DIAGNOSIS — Z23 Encounter for immunization: Secondary | ICD-10-CM | POA: Diagnosis not present

## 2021-04-15 MED ORDER — IBUPROFEN 800 MG PO TABS: 800.0000 mg | ORAL_TABLET | Freq: Three times a day (TID) | ORAL | 2 refills | Status: DC | PRN

## 2021-04-15 NOTE — Progress Notes (Signed)
Acute office Visit     This visit occurred during the SARS-CoV-2 public health emergency.  Safety protocols were in place, including screening questions prior to the visit, additional usage of staff PPE, and extensive cleaning of exam room while observing appropriate contact time as indicated for disinfecting solutions.    CC/Reason for Visit: Discuss red eye, requesting TB screening  HPI: Chelsea Beasley is a 70 y.o. female who is coming in today for the above mentioned reasons.  She works at a [employer].  They are requesting TB screening.  She also woke up this morning with some redness in her left eye.  It is bright red only in the medial part of her sclera.  She has had no URI symptoms.  Past Medical/Surgical History: Past Medical History:  Diagnosis Date   Anemia    Cancer ([place])    breast   Carpal tunnel syndrome on right    Heart murmur     Past Surgical History:  Procedure Laterality Date   APPENDECTOMY     BREAST BIOPSY Left 05/20/2005   BREAST SURGERY     left, total, 2006, cancer, no reconstruction   CARPAL TUNNEL RELEASE Right    MASTECTOMY Left 06/04/2005   NOSE SURGERY      Social History:  reports that she has never smoked. She has never used smokeless tobacco. She reports current alcohol use. She reports that she does not use drugs.  Allergies: No Known Allergies  Family History:  Family History  Problem Relation Age of Onset   Hypertension Mother    Cancer Mother        lung   Hypertension Sister    Colon cancer Neg Hx    Esophageal cancer Neg Hx    Stomach cancer Neg Hx    Rectal cancer Neg Hx      Current Outpatient Medications:    Hydrocortisone, Perianal, (PROCTO-PAK) 1 % CREA, Apply twice daily, Disp: 28 g, Rfl: 1   Multiple Vitamins-Calcium (ONE-A-DAY WOMENS PO), Take by mouth daily., Disp: , Rfl:    tacrolimus (PROTOPIC) 0.1 % ointment, Apply to lips twice daily, Disp: , Rfl:    ibuprofen (ADVIL) 800 MG  tablet, Take 1 tablet (800 mg total) by mouth 3 (three) times daily as needed., Disp: 60 tablet, Rfl: 2  Review of Systems:  Constitutional: Denies fever, chills, diaphoresis, appetite change and fatigue.  HEENT: Denies photophobia, eye pain, redness, hearing loss, ear pain, congestion, sore throat, rhinorrhea, sneezing, mouth sores, trouble swallowing, neck pain, neck stiffness and tinnitus.   Respiratory: Denies SOB, DOE, cough, chest tightness,  and wheezing.   Cardiovascular: Denies chest pain, palpitations and leg swelling.  Gastrointestinal: Denies nausea, vomiting, abdominal pain, diarrhea, constipation, blood in stool and abdominal distention.  Genitourinary: Denies dysuria, urgency, frequency, hematuria, flank pain and difficulty urinating.  Endocrine: Denies: hot or cold intolerance, sweats, changes in hair or nails, polyuria, polydipsia. Musculoskeletal: Denies myalgias, back pain, joint swelling, arthralgias and gait problem.  Skin: Denies pallor, rash and wound.  Neurological: Denies dizziness, seizures, syncope, weakness, light-headedness, numbness and headaches.  Hematological: Denies adenopathy. Easy bruising, personal or family bleeding history  Psychiatric/Behavioral: Denies suicidal ideation, mood changes, confusion, nervousness, sleep disturbance and agitation    Physical Exam: Vitals:   04/15/21 1522  BP: 124/80  Pulse: 62  Temp: 98.8 F (37.1 C)  TempSrc: Oral  SpO2: 99%  Weight: 131 lb 12.8 oz (59.8 kg)    Body mass  index is 24.11 kg/m.   Constitutional: NAD, calm, comfortable Eyes: PERRL, lids normal, subconjunctival hemorrhage is apparent in the medial left eye ENMT: Mucous membranes are moist.  Neurologic: Grossly intact and nonfocal Psychiatric: Normal judgment and insight. Alert and oriented x 3. Normal mood.    Impression and Plan:  Tuberculosis screening - Plan: QuantiFERON-TB Gold Plus  Subconjunctival hemorrhage of left eye -Have advised  observation, should improve over the course of the next week or 2.  Chronic pain of left knee  - Plan: ibuprofen (ADVIL) 800 MG tablet  Time spent: 21 minutes reviewing chart, interviewing and examining patient and formulating plan of care.     Lelon Frohlich, MD [facility]

## 2021-04-15 NOTE — Addendum Note (Signed)
Addended by: Westley Hummer B on: 04/15/2021 05:12 PM   Modules accepted: Orders

## 2021-04-16 DIAGNOSIS — Z111 Encounter for screening for respiratory tuberculosis: Secondary | ICD-10-CM | POA: Diagnosis not present

## 2021-04-16 NOTE — Addendum Note (Signed)
Addended by: Elmer Picker on: 04/16/2021 09:04 AM   Modules accepted: Orders

## 2021-04-21 LAB — QUANTIFERON-TB GOLD PLUS
Mitogen-NIL: >10 IU/mL
NIL: 0.05 IU/mL
QuantiFERON-TB Gold Plus: NEGATIVE
TB1-NIL: 0 IU/mL
TB2-NIL: 0.01 IU/mL

## 2021-04-22 ENCOUNTER — Encounter: Payer: Medicare HMO | Admitting: Internal Medicine

## 2021-04-28 ENCOUNTER — Other Ambulatory Visit: Payer: Self-pay

## 2021-04-29 ENCOUNTER — Ambulatory Visit (INDEPENDENT_AMBULATORY_CARE_PROVIDER_SITE_OTHER): Payer: Medicare HMO | Admitting: Internal Medicine

## 2021-04-29 ENCOUNTER — Encounter: Payer: Self-pay | Admitting: Internal Medicine

## 2021-04-29 VITALS — BP 120/80 | HR 64 | Temp 98.8°F | Ht 62.0 in | Wt 129.5 lb

## 2021-04-29 DIAGNOSIS — Z780 Asymptomatic menopausal state: Secondary | ICD-10-CM | POA: Diagnosis not present

## 2021-04-29 DIAGNOSIS — Z0000 Encounter for general adult medical examination without abnormal findings: Secondary | ICD-10-CM

## 2021-04-29 DIAGNOSIS — Z13820 Encounter for screening for osteoporosis: Secondary | ICD-10-CM

## 2021-04-29 DIAGNOSIS — E559 Vitamin D deficiency, unspecified: Secondary | ICD-10-CM

## 2021-04-29 DIAGNOSIS — M25562 Pain in left knee: Secondary | ICD-10-CM | POA: Diagnosis not present

## 2021-04-29 NOTE — Progress Notes (Signed)
Established Patient Office Visit     This visit occurred during the SARS-CoV-2 public health emergency.  Safety protocols were in place, including screening questions prior to the visit, additional usage of staff PPE, and extensive cleaning of exam room while observing appropriate contact time as indicated for disinfecting solutions.    CC/Reason for Visit: Annual preventive exam and subsequent Medicare wellness visit  HPI: Chelsea Beasley is a 70 y.o. female who is coming in today for the above mentioned reasons. Past Medical History is significant for: Prior history of breast cancer and vitamin D deficiency he has otherwise been in good health.  She has routine eye and dental care.  She has no hearing difficulty.  She does not exercise routinely.  She currently works in a [employer].  She is overdue for shingles vaccines and COVID booster.  She had a colonoscopy in 2019, she had a mammogram in 2022.  She had a Pap smear in 2020.  She has been complaining of some left knee pain, has already seen sports medicine and is requesting referral to orthopedics.   Past Medical/Surgical History: Past Medical History:  Diagnosis Date   Anemia    Cancer ([place])    breast   Carpal tunnel syndrome on right    Heart murmur     Past Surgical History:  Procedure Laterality Date   APPENDECTOMY     BREAST BIOPSY Left 05/20/2005   BREAST SURGERY     left, total, 2006, cancer, no reconstruction   CARPAL TUNNEL RELEASE Right    MASTECTOMY Left 06/04/2005   NOSE SURGERY      Social History:  reports that she has never smoked. She has never used smokeless tobacco. She reports current alcohol use. She reports that she does not use drugs.  Allergies: No Known Allergies  Family History:  Family History  Problem Relation Age of Onset   Hypertension Mother    Cancer Mother        lung   Hypertension Sister    Colon cancer Neg Hx    Esophageal cancer Neg Hx    Stomach cancer Neg Hx     Rectal cancer Neg Hx      Current Outpatient Medications:    Hydrocortisone, Perianal, (PROCTO-PAK) 1 % CREA, Apply twice daily, Disp: 28 g, Rfl: 1   ibuprofen (ADVIL) 800 MG tablet, Take 1 tablet (800 mg total) by mouth 3 (three) times daily as needed., Disp: 60 tablet, Rfl: 2   Multiple Vitamins-Calcium (ONE-A-DAY WOMENS PO), Take by mouth daily., Disp: , Rfl:    tacrolimus (PROTOPIC) 0.1 % ointment, Apply to lips twice daily, Disp: , Rfl:   Review of Systems:  Constitutional: Denies fever, chills, diaphoresis, appetite change and fatigue.  HEENT: Denies photophobia, eye pain, redness, hearing loss, ear pain, congestion, sore throat, rhinorrhea, sneezing, mouth sores, trouble swallowing, neck pain, neck stiffness and tinnitus.   Respiratory: Denies SOB, DOE, cough, chest tightness,  and wheezing.   Cardiovascular: Denies chest pain, palpitations and leg swelling.  Gastrointestinal: Denies nausea, vomiting, abdominal pain, diarrhea, constipation, blood in stool and abdominal distention.  Genitourinary: Denies dysuria, urgency, frequency, hematuria, flank pain and difficulty urinating.  Endocrine: Denies: hot or cold intolerance, sweats, changes in hair or nails, polyuria, polydipsia. Musculoskeletal: Denies myalgias, back pain, joint swelling and gait problem.  Skin: Denies pallor, rash and wound.  Neurological: Denies dizziness, seizures, syncope, weakness, light-headedness, numbness and headaches.  Hematological: Denies adenopathy. Easy bruising, personal or family  bleeding history  Psychiatric/Behavioral: Denies suicidal ideation, mood changes, confusion, nervousness, sleep disturbance and agitation    Physical Exam: Vitals:   04/29/21 1453  BP: 120/80  Pulse: 64  Temp: 98.8 F (37.1 C)  TempSrc: Oral  SpO2: 97%  Weight: 129 lb 8 oz (58.7 kg)  Height: 5\' 2"  (1.575 m)    Body mass index is 23.69 kg/m.   Constitutional: NAD, calm, comfortable Eyes: PERRL, lids and  conjunctivae normal ENMT: Mucous membranes are moist. Posterior pharynx clear of any exudate or lesions. Normal dentition. Tympanic membrane is pearly white, no erythema or bulging. Neck: normal, supple, no masses, no thyromegaly Respiratory: clear to auscultation bilaterally, no wheezing, no crackles. Normal respiratory effort. No accessory muscle use.  Cardiovascular: Regular rate and rhythm, no murmurs / rubs / gallops. No extremity edema. 2+ pedal pulses. No carotid bruits.  Abdomen: no tenderness, no masses palpated. No hepatosplenomegaly. Bowel sounds positive.  Musculoskeletal: no clubbing / cyanosis. No joint deformity upper and lower extremities. Good ROM, no contractures. Normal muscle tone.  Skin: no rashes, lesions, ulcers. No induration Neurologic: CN 2-12 grossly intact. Sensation intact, DTR normal. Strength 5/5 in all 4.  Psychiatric: Normal judgment and insight. Alert and oriented x 3. Normal mood.    Subsequent Medicare wellness visit   1. Risk factors, based on past  M,S,F -cardiovascular disease risk factors include age only   2.  Physical activities: Other than work has no specific daily exercise   3.  Depression/mood: Stable, not depressed   4.  Hearing: No perceived issues   5.  ADL's: Independent in all ADLs   6.  Fall risk: Low fall risk   7.  Home safety: No problems identified   8.  Height weight, and visual acuity: height and weight as above, vision:  Vision Screening   Right eye Left eye Both eyes  Without correction     With correction 20/25 20/20 20/20      9.  Counseling: Advise she update her vaccination status   10. Lab orders based on risk factors: Laboratory update will be reviewed   11. Referral : Orthopedics   12. Care plan: Follow-up with me in 6 to 12 months   13. Cognitive assessment: No cognitive impairment   14. Screening: Patient provided with a written and personalized 5-10 year screening schedule in the AVS. yes   15.  Provider List Update: PCP only  16. Advance Directives: Full code   17. Opioids: Patient is not on any opioid prescriptions and has no risk factors for a substance use disorder.   [place] Office Visit from 04/29/2021 in [facility] [place]  PHQ-9 Total Score 0       Fall Risk  04/29/2021 02/14/2020 01/12/2019 12/23/2017  Falls in the past year? 0 0 0 No  Number falls in past yr: 0 0 0 -  Injury with Fall? 0 0 0 -     Impression and Plan:  Encounter for preventive health examination  - Plan: CBC with Differential/Platelet, Comprehensive metabolic panel, Hemoglobin A1c, Lipid panel, TSH, Vitamin B12, Vitamin B12, TSH, Lipid panel, Hemoglobin A1c, Comprehensive metabolic panel, CBC with Differential/Platelet -She has routine eye and dental care. -Advised to get her COVID booster and shingles vaccines at pharmacy but otherwise immunizations are up-to-date. -Labs will be updated today. -Healthy lifestyle discussed in detail. -She had a colonoscopy in 2019 and is a 10-year callback. -She had a negative mammogram in July 2022. -DEXA scan will be ordered  for osteoporosis screening. -She had a negative Pap smear in June 2020.  Vitamin D deficiency  - Plan: VITAMIN D 25 Hydroxy (Vit-D Deficiency, Fractures)  Left knee pain, unspecified chronicity  - Plan: AMB referral to orthopedics    Patient Instructions  -Nice seeing you today!!  -Lab work today; will notify you once results are available.  -Remember your shingles vaccine and COVID booster at the pharmacy.  -Schedule follow up in 6-12 months.     Lelon Frohlich, MD [facility] at [facility]

## 2021-04-29 NOTE — Patient Instructions (Signed)
-  Nice seeing you today!!  -Lab work today; will notify you once results are available.  -Remember your shingles vaccine and COVID booster at the pharmacy.  -Schedule follow up in 6-12 months.

## 2021-04-30 ENCOUNTER — Other Ambulatory Visit: Payer: Self-pay | Admitting: Internal Medicine

## 2021-04-30 DIAGNOSIS — E559 Vitamin D deficiency, unspecified: Secondary | ICD-10-CM

## 2021-04-30 LAB — CBC WITH DIFFERENTIAL/PLATELET
Basophils Absolute: 0.1 10*3/uL (ref 0.0–0.1)
Basophils Relative: 1.4 % (ref 0.0–3.0)
Eosinophils Absolute: 0.1 10*3/uL (ref 0.0–0.7)
Eosinophils Relative: 0.9 % (ref 0.0–5.0)
HCT: 34.5 % — ABNORMAL LOW (ref 36.0–46.0)
Hemoglobin: 11.1 g/dL — ABNORMAL LOW (ref 12.0–15.0)
Lymphocytes Relative: 31.7 % (ref 12.0–46.0)
Lymphs Abs: 1.9 10*3/uL (ref 0.7–4.0)
MCHC: 32.1 g/dL (ref 30.0–36.0)
MCV: 89.5 fl (ref 78.0–100.0)
Monocytes Absolute: 0.4 10*3/uL (ref 0.1–1.0)
Monocytes Relative: 6.3 % (ref 3.0–12.0)
Neutro Abs: 3.7 10*3/uL (ref 1.4–7.7)
Neutrophils Relative %: 59.7 % (ref 43.0–77.0)
Platelets: 224 10*3/uL (ref 150.0–400.0)
RBC: 3.85 Mil/uL — ABNORMAL LOW (ref 3.87–5.11)
RDW: 12.5 % (ref 11.5–15.5)
WBC: 6.1 10*3/uL (ref 4.0–10.5)

## 2021-04-30 LAB — LIPID PANEL
Cholesterol: 191 mg/dL (ref 0–200)
HDL: 81.3 mg/dL (ref >39.00)
LDL Cholesterol: 98 mg/dL (ref 0–99)
NonHDL: 109.24
Total CHOL/HDL Ratio: 2
Triglycerides: 54 mg/dL (ref 0.0–149.0)
VLDL: 10.8 mg/dL (ref 0.0–40.0)

## 2021-04-30 LAB — TSH: TSH: 1.03 u[IU]/mL (ref 0.35–5.50)

## 2021-04-30 LAB — COMPREHENSIVE METABOLIC PANEL
ALT: 12 U/L (ref 0–35)
AST: 21 U/L (ref 0–37)
Albumin: 4.3 g/dL (ref 3.5–5.2)
Alkaline Phosphatase: 49 U/L (ref 39–117)
BUN: 13 mg/dL (ref 6–23)
CO2: 29 mEq/L (ref 19–32)
Calcium: 9.9 mg/dL (ref 8.4–10.5)
Chloride: 102 mEq/L (ref 96–112)
Creatinine, Ser: 0.71 mg/dL (ref 0.40–1.20)
GFR: 87.17 mL/min (ref >60.00)
Glucose, Bld: 75 mg/dL (ref 70–99)
Potassium: 4.2 mEq/L (ref 3.5–5.1)
Sodium: 138 mEq/L (ref 135–145)
Total Bilirubin: 0.6 mg/dL (ref 0.2–1.2)
Total Protein: 7.2 g/dL (ref 6.0–8.3)

## 2021-04-30 LAB — HEMOGLOBIN A1C: Hgb A1c MFr Bld: 4.9 % (ref 4.6–6.5)

## 2021-04-30 LAB — VITAMIN D 25 HYDROXY (VIT D DEFICIENCY, FRACTURES): VITD: 23.06 ng/mL — ABNORMAL LOW (ref 30.00–100.00)

## 2021-04-30 LAB — VITAMIN B12: Vitamin B-12: 312 pg/mL (ref 211–911)

## 2021-04-30 MED ORDER — VITAMIN D (ERGOCALCIFEROL) 1.25 MG (50000 UNIT) PO CAPS: 50000.0000 [IU] | ORAL_CAPSULE | ORAL | 0 refills | Status: AC

## 2021-05-06 ENCOUNTER — Ambulatory Visit: Payer: Medicare HMO | Admitting: Orthopaedic Surgery

## 2021-05-13 ENCOUNTER — Ambulatory Visit (INDEPENDENT_AMBULATORY_CARE_PROVIDER_SITE_OTHER): Payer: Medicare HMO | Admitting: Orthopaedic Surgery

## 2021-05-13 ENCOUNTER — Other Ambulatory Visit: Payer: Self-pay

## 2021-05-13 ENCOUNTER — Encounter: Payer: Self-pay | Admitting: Orthopaedic Surgery

## 2021-05-13 DIAGNOSIS — S83242A Other tear of medial meniscus, current injury, left knee, initial encounter: Secondary | ICD-10-CM

## 2021-05-13 NOTE — Progress Notes (Signed)
Office Visit Note   Patient: Chelsea Beasley           Date of Birth: 09-29-1950           MRN: [id number] Visit Date: 05/13/2021              Requested by: Isaac Bliss, Rayford Halsted, MD [place],  [place] [address] PCP: Isaac Bliss, Rayford Halsted, MD   Assessment & Plan: Visit Diagnoses:  1. Acute medial meniscus tear of left knee, initial encounter     Plan: I reviewed the MRI with the patient today and there is a significant medial meniscus tear with a horizontal component which is likely the source of the majority of her pain.  She has not received any relief from conservative management so far with cortisone injections and physician directed activity modifications.  She understands that there are grade 3 and 4 changes at the medial compartment which will likely not improve from the arthroscopy but we both feel that the medial meniscus tear is likely responsible for the majority of her symptoms therefore we have agreed to proceed with left knee arthroscopy and partial medial meniscectomy and chondroplasty as indicated.  She understands the risks of infection, incomplete relief of pain, need for additional surgery in the future such as a total knee replacement.  Questions encouraged and answered.  Follow-Up Instructions: No follow-ups on file.   Orders:  No orders of the defined types were placed in this encounter.  No orders of the defined types were placed in this encounter.     Procedures: No procedures performed   Clinical Data: No additional findings.   Subjective: Chief Complaint  Patient presents with   Left Knee - Follow-up    HPI patient is a pleasant 70 year old female who comes in today with chronic left knee pain.  History of advanced degenerative joint disease.  She has seen [place] in the past where she has undergone cortisone as well as viscosupplementation injections without relief of symptoms.  Her last injection was a viscosupplementation  injection was September 24, 2020.  She continues to complain of pain primarily to the medial aspect.  She describes the pain is constant but does have increased pain with pivoting.  She notes an occasional locking catching sensation.  She also has increased pain going from a seated to standing position.  She has been wearing a knee brace and taking ibuprofen without relief.  Her pain does not awaken her from sleep, but she is unable to play pickle ball or participate in other activities.  Previous MRI from January of this past year shows moderate degenerative changes to the medial and patellofemoral compartments in addition to medial meniscus tear.  Review of Systems as detailed in HPI.  All other reviewed and are negative.   Objective: Vital Signs: LMP 01/27/2005 (Approximate)   Physical Exam well-developed well-nourished female no acute distress.  Alert and oriented x3.  Ortho Exam left knee exam shows a trace effusion.  Range of motion 0 to 110 degrees.  Marked tenderness medial joint line.  Positive McMurray's sign with locking sensation.  Ligaments are stable.  She is neurovascular intact distally.  Specialty Comments:  No specialty comments available.  Imaging: No new imaging   PMFS History: Patient Active Problem List   Diagnosis Date Noted   Vitamin D deficiency 01/13/2019   Routine general medical examination at a health care facility 12/23/2017   Chest pain 12/23/2017   Dryness of vagina  11/13/2013   VERRUCA VULGARIS 10/23/2008   Anemia 09/24/2008   MASTECTOMY, LEFT, HX OF 09/24/2007   Past Medical History:  Diagnosis Date   Anemia    Cancer ([place])    breast   Carpal tunnel syndrome on right    Heart murmur     Family History  Problem Relation Age of Onset   Hypertension Mother    Cancer Mother        lung   Hypertension Sister    Colon cancer Neg Hx    Esophageal cancer Neg Hx    Stomach cancer Neg Hx    Rectal cancer Neg Hx     Past Surgical History:  Procedure  Laterality Date   APPENDECTOMY     BREAST BIOPSY Left 05/20/2005   BREAST SURGERY     left, total, 2006, cancer, no reconstruction   CARPAL TUNNEL RELEASE Right    MASTECTOMY Left 06/04/2005   NOSE SURGERY     Social History   Occupational History    Employer: [employer]    Comment: IT sales professional  Tobacco Use   Smoking status: Never   Smokeless tobacco: Never  Substance and Sexual Activity   Alcohol use: Yes    Comment: Occasional beer   Drug use: No   Sexual activity: Not on file

## 2021-05-19 ENCOUNTER — Other Ambulatory Visit: Payer: Self-pay | Admitting: Physician Assistant

## 2021-05-19 MED ORDER — ONDANSETRON HCL 4 MG PO TABS: 4.0000 mg | ORAL_TABLET | Freq: Three times a day (TID) | ORAL | 0 refills | Status: DC | PRN

## 2021-05-19 MED ORDER — HYDROCODONE-ACETAMINOPHEN 5-325 MG PO TABS: 1.0000 | ORAL_TABLET | Freq: Four times a day (QID) | ORAL | 0 refills | Status: DC | PRN

## 2021-05-22 ENCOUNTER — Encounter: Payer: Self-pay | Admitting: Orthopaedic Surgery

## 2021-05-22 DIAGNOSIS — M94262 Chondromalacia, left knee: Secondary | ICD-10-CM | POA: Diagnosis not present

## 2021-05-22 DIAGNOSIS — M659 Synovitis and tenosynovitis, unspecified: Secondary | ICD-10-CM | POA: Diagnosis not present

## 2021-05-22 DIAGNOSIS — G8918 Other acute postprocedural pain: Secondary | ICD-10-CM | POA: Diagnosis not present

## 2021-05-22 DIAGNOSIS — S83242A Other tear of medial meniscus, current injury, left knee, initial encounter: Secondary | ICD-10-CM

## 2021-05-22 DIAGNOSIS — S83232A Complex tear of medial meniscus, current injury, left knee, initial encounter: Secondary | ICD-10-CM | POA: Diagnosis not present

## 2021-05-26 ENCOUNTER — Telehealth: Payer: Self-pay | Admitting: Orthopaedic Surgery

## 2021-05-26 NOTE — Telephone Encounter (Signed)
Patient called advised she received a knee brace and a part that goers to the pump. Patient said the pump was not there. Patient asked if she needed the pump? The number to contact patient is [phone]

## 2021-05-27 NOTE — Telephone Encounter (Signed)
What pump is she talking about?

## 2021-05-28 NOTE — Telephone Encounter (Signed)
Got it from surgical center she states. States she has an appt tomorrow and will bring it in then.

## 2021-05-29 ENCOUNTER — Encounter: Payer: Self-pay | Admitting: Orthopaedic Surgery

## 2021-05-29 ENCOUNTER — Other Ambulatory Visit: Payer: Self-pay

## 2021-05-29 ENCOUNTER — Ambulatory Visit (INDEPENDENT_AMBULATORY_CARE_PROVIDER_SITE_OTHER): Payer: Medicare HMO | Admitting: Orthopaedic Surgery

## 2021-05-29 DIAGNOSIS — S83242A Other tear of medial meniscus, current injury, left knee, initial encounter: Secondary | ICD-10-CM

## 2021-05-29 NOTE — Progress Notes (Signed)
   Post-Op Visit Note   Patient: Chelsea Beasley           Date of Birth: 02-09-1951           MRN: [id number] Visit Date: 05/29/2021 PCP: Isaac Bliss, Rayford Halsted, MD   Assessment & Plan:  Chief Complaint:  Chief Complaint  Patient presents with   Left Knee - Follow-up    Left knee arthroscopy 05/22/2021   Visit Diagnoses:  1. Acute medial meniscus tear of left knee, initial encounter     Plan: [name] is 1 week status post left knee arthroscopy partial medial meniscectomy and chondroplasty.  Doing well overall and has no real complaints other than some soreness from the surgery.  Left knee shows healed surgical incisions.  Range of motion has improved significantly to greater than 90 degrees of flexion.  She has some mild to moderate swelling and bruising.  No calf tenderness.  Patient is doing very well today and we provided home exercises.  Sutures removed.  We need continue to keep her out of work until the 28th of this month so that she can get stronger and get better range of motion.  Recheck in 4 weeks.  Follow-Up Instructions: Return in about 4 weeks (around 06/26/2021).   Orders:  No orders of the defined types were placed in this encounter.  No orders of the defined types were placed in this encounter.   Imaging: No results found.  PMFS History: Patient Active Problem List   Diagnosis Date Noted   Acute medial meniscus tear, left, initial encounter 05/22/2021   Vitamin D deficiency 01/13/2019   Routine general medical examination at a health care facility 12/23/2017   Chest pain 12/23/2017   Dryness of vagina 11/13/2013   VERRUCA VULGARIS 10/23/2008   Anemia 09/24/2008   MASTECTOMY, LEFT, HX OF 09/24/2007   Past Medical History:  Diagnosis Date   Anemia    Cancer ([place])    breast   Carpal tunnel syndrome on right    Heart murmur     Family History  Problem Relation Age of Onset   Hypertension Mother    Cancer Mother        lung   Hypertension  Sister    Colon cancer Neg Hx    Esophageal cancer Neg Hx    Stomach cancer Neg Hx    Rectal cancer Neg Hx     Past Surgical History:  Procedure Laterality Date   APPENDECTOMY     BREAST BIOPSY Left 05/20/2005   BREAST SURGERY     left, total, 2006, cancer, no reconstruction   CARPAL TUNNEL RELEASE Right    MASTECTOMY Left 06/04/2005   NOSE SURGERY     Social History   Occupational History    Employer: [employer]    Comment: IT sales professional  Tobacco Use   Smoking status: Never   Smokeless tobacco: Never  Substance and Sexual Activity   Alcohol use: Yes    Comment: Occasional beer   Drug use: No   Sexual activity: Not on file

## 2021-06-04 DIAGNOSIS — H524 Presbyopia: Secondary | ICD-10-CM | POA: Diagnosis not present

## 2021-06-13 ENCOUNTER — Telehealth: Payer: Self-pay | Admitting: Orthopaedic Surgery

## 2021-06-13 NOTE — Telephone Encounter (Signed)
Pt called requesting a call back concerning a back to work not. She needs to discuss if she can go back full duty or restrictions. Pt has an upcoming appt 12/5 but need note by 11/28. Pt phone number is [phone].

## 2021-06-14 NOTE — Telephone Encounter (Signed)
I am happy to release her back to what ever type of work she feels comfortable doing

## 2021-06-16 NOTE — Telephone Encounter (Signed)
Note made sent through my chart. Would like to return to work 06/23/2021.

## 2021-06-16 NOTE — Telephone Encounter (Signed)
Called patient no answer LMOM. Need to know when she would like to RTW.

## 2021-07-01 ENCOUNTER — Ambulatory Visit: Payer: Medicare HMO | Admitting: Orthopaedic Surgery

## 2021-07-01 ENCOUNTER — Encounter: Payer: Self-pay | Admitting: Orthopaedic Surgery

## 2021-07-01 ENCOUNTER — Other Ambulatory Visit: Payer: Self-pay

## 2021-07-01 DIAGNOSIS — Z9889 Other specified postprocedural states: Secondary | ICD-10-CM

## 2021-07-01 NOTE — Progress Notes (Signed)
   Post-Op Visit Note   Patient: Chelsea Beasley           Date of Birth: August 20, 1950           MRN: [id number] Visit Date: 07/01/2021 PCP: Isaac Bliss, Rayford Halsted, MD   Assessment & Plan:  Chief Complaint:  Chief Complaint  Patient presents with   Left Knee - Pain   Visit Diagnoses:  1. S/P arthroscopy of left knee     Plan: Chelsea Beasley is a 6-week status post a left knee arthroscopy partial medial meniscectomy.  She returned back to work couple weeks ago.  Overall doing well has no real complaints other than some swelling near the portal sites.  Left knee shows fully healed surgical scars.  Excellent range of motion.  No joint effusion.  Chelsea Beasley is done very well from the surgery and has had complete relief of symptoms.  She is very happy overall with the outcome.  At this point she can continue to increase activity as tolerated.  Follow-up as needed.  Follow-Up Instructions: Return if symptoms worsen or fail to improve.   Orders:  No orders of the defined types were placed in this encounter.  No orders of the defined types were placed in this encounter.   Imaging: No results found.  PMFS History: Patient Active Problem List   Diagnosis Date Noted   Acute medial meniscus tear, left, initial encounter 05/22/2021   Vitamin D deficiency 01/13/2019   Routine general medical examination at a health care facility 12/23/2017   Chest pain 12/23/2017   Dryness of vagina 11/13/2013   VERRUCA VULGARIS 10/23/2008   Anemia 09/24/2008   MASTECTOMY, LEFT, HX OF 09/24/2007   Past Medical History:  Diagnosis Date   Anemia    Cancer ([place])    breast   Carpal tunnel syndrome on right    Heart murmur     Family History  Problem Relation Age of Onset   Hypertension Mother    Cancer Mother        lung   Hypertension Sister    Colon cancer Neg Hx    Esophageal cancer Neg Hx    Stomach cancer Neg Hx    Rectal cancer Neg Hx     Past Surgical History:  Procedure Laterality Date    APPENDECTOMY     BREAST BIOPSY Left 05/20/2005   BREAST SURGERY     left, total, 2006, cancer, no reconstruction   CARPAL TUNNEL RELEASE Right    MASTECTOMY Left 06/04/2005   NOSE SURGERY     Social History   Occupational History    Employer: [employer]    Comment: IT sales professional  Tobacco Use   Smoking status: Never   Smokeless tobacco: Never  Substance and Sexual Activity   Alcohol use: Yes    Comment: Occasional beer   Drug use: No   Sexual activity: Not on file

## 2021-09-22 ENCOUNTER — Other Ambulatory Visit: Payer: Self-pay | Admitting: Internal Medicine

## 2021-09-22 DIAGNOSIS — Z1231 Encounter for screening mammogram for malignant neoplasm of breast: Secondary | ICD-10-CM

## 2021-09-30 ENCOUNTER — Encounter: Payer: Medicare HMO | Admitting: Internal Medicine

## 2021-10-08 ENCOUNTER — Ambulatory Visit (INDEPENDENT_AMBULATORY_CARE_PROVIDER_SITE_OTHER): Payer: Medicare HMO | Admitting: Internal Medicine

## 2021-10-08 VITALS — BP 130/80 | HR 70 | Temp 97.9°F | Ht 61.0 in | Wt 134.2 lb

## 2021-10-08 DIAGNOSIS — Z13820 Encounter for screening for osteoporosis: Secondary | ICD-10-CM | POA: Diagnosis not present

## 2021-10-08 DIAGNOSIS — G8929 Other chronic pain: Secondary | ICD-10-CM

## 2021-10-08 DIAGNOSIS — M25562 Pain in left knee: Secondary | ICD-10-CM

## 2021-10-08 DIAGNOSIS — Z780 Asymptomatic menopausal state: Secondary | ICD-10-CM | POA: Diagnosis not present

## 2021-10-08 NOTE — Progress Notes (Signed)
? ? ? ?Acute office Visit ? ? ? ? ?This visit occurred during the SARS-CoV-2 public health emergency.  Safety protocols were in place, including screening questions prior to the visit, additional usage of staff PPE, and extensive cleaning of exam room while observing appropriate contact time as indicated for disinfecting solutions.  ? ? ?CC/Reason for Visit: Left knee pain ? ?HPI: Chelsea Beasley is a 71 y.o. female who is coming in today for the above mentioned reasons.  She had a left meniscal knee surgery in October of last year.  For the past couple weeks she has been experiencing a flareup of pain.  She has not yet contacted orthopedist.  Her knee feels swollen.  She is having difficulty walking.  She is still working full-time at [employer]. ? ?Past Medical/Surgical History: ?Past Medical History:  ?Diagnosis Date  ? Anemia   ? Cancer [facility])   ? breast  ? Carpal tunnel syndrome on right   ? Heart murmur   ? ? ?Past Surgical History:  ?Procedure Laterality Date  ? APPENDECTOMY    ? BREAST BIOPSY Left 05/20/2005  ? BREAST SURGERY    ? left, total, 2006, cancer, no reconstruction  ? CARPAL TUNNEL RELEASE Right   ? MASTECTOMY Left 06/04/2005  ? NOSE SURGERY    ? ? ?Social History: ? reports that she has never smoked. She has never used smokeless tobacco. She reports current alcohol use. She reports that she does not use drugs. ? ?Allergies: ?No Known Allergies ? ?Family History:  ?Family History  ?Problem Relation Age of Onset  ? Hypertension Mother   ? Cancer Mother   ?     lung  ? Hypertension Sister   ? Colon cancer Neg Hx   ? Esophageal cancer Neg Hx   ? Stomach cancer Neg Hx   ? Rectal cancer Neg Hx   ? ? ? ?Current Outpatient Medications:  ?  ibuprofen (ADVIL) 800 MG tablet, Take 1 tablet (800 mg total) by mouth 3 (three) times daily as needed., Disp: 60 tablet, Rfl: 2 ?  Multiple Vitamins-Calcium (ONE-A-DAY WOMENS PO), Take by mouth daily., Disp: , Rfl:  ? ?Review of Systems:  ?Constitutional:  Denies fever, chills, diaphoresis, appetite change and fatigue.  ?HEENT: Denies photophobia, eye pain, redness, hearing loss, ear pain, congestion, sore throat, rhinorrhea, sneezing, mouth sores, trouble swallowing, neck pain, neck stiffness and tinnitus.   ?Respiratory: Denies SOB, DOE, cough, chest tightness,  and wheezing.   ?Cardiovascular: Denies chest pain, palpitations and leg swelling.  ?Gastrointestinal: Denies nausea, vomiting, abdominal pain, diarrhea, constipation, blood in stool and abdominal distention.  ?Genitourinary: Denies dysuria, urgency, frequency, hematuria, flank pain and difficulty urinating.  ?Endocrine: Denies: hot or cold intolerance, sweats, changes in hair or nails, polyuria, polydipsia. ?Musculoskeletal: Denies myalgias, back pain. ?Skin: Denies pallor, rash and wound.  ?Neurological: Denies dizziness, seizures, syncope, weakness, light-headedness, numbness and headaches.  ?Hematological: Denies adenopathy. Easy bruising, personal or family bleeding history  ?Psychiatric/Behavioral: Denies suicidal ideation, mood changes, confusion, nervousness, sleep disturbance and agitation ? ? ? ?Physical Exam: ?Vitals:  ? 10/08/21 1411  ?BP: 130/80  ?Pulse: 70  ?Temp: 97.9 ?F (36.6 ?C)  ?TempSrc: Oral  ?SpO2: 98%  ?Weight: 134 lb 3.2 oz (60.9 kg)  ?Height: '5\' 1"'$  (1.549 m)  ? ? ?Body mass index is 25.36 kg/m?. ? ? ?Constitutional: NAD, calm, comfortable ?Eyes: PERRL, lids and conjunctivae normal ?ENMT: Mucous membranes are moist.  ?Musculoskeletal: Obvious left knee effusion ?Psychiatric: Normal judgment  and insight. Alert and oriented x 3. Normal mood.  ? ? ?Impression and Plan: ? ?Encounter for osteoporosis screening in asymptomatic postmenopausal patient  ?- Plan: DG Bone Density ? ?Chronic pain of left knee ?-Have advised she contact her orthopedist for further evaluation and management, she apparently has a joint effusion, unclear if would benefit from drainage and may be steroid  injection. ? ?Time spent: 21 minutes reviewing chart, interviewing and examining patient and formulating plan of care. ? ? ? ? ?Lelon Frohlich, MD ?[facility] at [facility] ? ? ?

## 2021-10-14 ENCOUNTER — Other Ambulatory Visit: Payer: Self-pay | Admitting: Internal Medicine

## 2021-10-14 DIAGNOSIS — Z13820 Encounter for screening for osteoporosis: Secondary | ICD-10-CM

## 2021-10-17 ENCOUNTER — Telehealth: Payer: Self-pay | Admitting: Internal Medicine

## 2021-10-17 NOTE — Telephone Encounter (Signed)
I  called pt on 3-21, 3-22, 3-24 and her vm is not set up unable to leave a message. I was calling  to sch pt for bone density test ?

## 2021-10-21 ENCOUNTER — Other Ambulatory Visit: Payer: Self-pay

## 2021-10-21 ENCOUNTER — Ambulatory Visit (INDEPENDENT_AMBULATORY_CARE_PROVIDER_SITE_OTHER): Payer: Medicare HMO | Admitting: Orthopaedic Surgery

## 2021-10-21 DIAGNOSIS — S83242A Other tear of medial meniscus, current injury, left knee, initial encounter: Secondary | ICD-10-CM | POA: Diagnosis not present

## 2021-10-21 DIAGNOSIS — M1712 Unilateral primary osteoarthritis, left knee: Secondary | ICD-10-CM

## 2021-10-21 DIAGNOSIS — Z9889 Other specified postprocedural states: Secondary | ICD-10-CM

## 2021-10-21 NOTE — Progress Notes (Signed)
? ?  Office Visit Note ?  ?Patient: Chelsea Beasley           ?Date of Birth: 1950-12-16           ?MRN: [id number] ?Visit Date: 10/21/2021 ?             ?Requested by: Isaac Bliss, Rayford Halsted, MD ?[address] ?[place],  [place] [address] ?PCP: Isaac Bliss, Rayford Halsted, MD ? ? ?Assessment & Plan: ?Visit Diagnoses:  ?1. S/P arthroscopy of left knee   ?2. Acute medial meniscus tear of left knee, initial encounter   ?3. Primary osteoarthritis of left knee   ? ? ?Plan: Chelsea Beasley is status post left knee arthroscopy partial medial meniscectomy on 05/22/2021.  She has redeveloped swelling in the knee again.  She feels some start up stiffness and discomfort.  Denies any constitutional symptoms. ? ?Examination left knee shows a small joint effusion.  Range of motion is preserved.  No joint line tenderness. ? ?Based on findings the effusion has reaccumulated.  I am not getting the sense that she has reinjured the meniscus.  This sounds like inflammatory.  Treatment options were discussed and she will start with relative rest, ice, compression, NSAIDs.  Follow-up as needed. ? ?Follow-Up Instructions: No follow-ups on file.  ? ?Orders:  ?No orders of the defined types were placed in this encounter. ? ?No orders of the defined types were placed in this encounter. ? ? ? ? Procedures: ?No procedures performed ? ? ?Clinical Data: ?No additional findings. ? ? ?Subjective: ?Chief Complaint  ?Patient presents with  ? Left Knee - Pain  ? ? ?HPI ? ?Review of Systems ? ? ?Objective: ?Vital Signs: LMP 01/27/2005 (Approximate)  ? ?Physical Exam ? ?Ortho Exam ? ?Specialty Comments:  ?No specialty comments available. ? ?Imaging: ?No results found. ? ? ?PMFS History: ?Patient Active Problem List  ? Diagnosis Date Noted  ? Acute medial meniscus tear, left, initial encounter 05/22/2021  ? Vitamin D deficiency 01/13/2019  ? Routine general medical examination at a health care facility 12/23/2017  ? Chest pain 12/23/2017  ? Dryness of vagina  11/13/2013  ? VERRUCA VULGARIS 10/23/2008  ? Anemia 09/24/2008  ? MASTECTOMY, LEFT, HX OF 09/24/2007  ? ?Past Medical History:  ?Diagnosis Date  ? Anemia   ? Cancer [facility])   ? breast  ? Carpal tunnel syndrome on right   ? Heart murmur   ?  ?Family History  ?Problem Relation Age of Onset  ? Hypertension Mother   ? Cancer Mother   ?     lung  ? Hypertension Sister   ? Colon cancer Neg Hx   ? Esophageal cancer Neg Hx   ? Stomach cancer Neg Hx   ? Rectal cancer Neg Hx   ?  ?Past Surgical History:  ?Procedure Laterality Date  ? APPENDECTOMY    ? BREAST BIOPSY Left 05/20/2005  ? BREAST SURGERY    ? left, total, 2006, cancer, no reconstruction  ? CARPAL TUNNEL RELEASE Right   ? MASTECTOMY Left 06/04/2005  ? NOSE SURGERY    ? ?Social History  ? ?Occupational History  ?  Employer: [employer]  ?  Comment: audit coordinator  ?Tobacco Use  ? Smoking status: Never  ? Smokeless tobacco: Never  ?Substance and Sexual Activity  ? Alcohol use: Yes  ?  Comment: Occasional beer  ? Drug use: No  ? Sexual activity: Not on file  ? ? ? ? ? ? ?

## 2021-12-16 ENCOUNTER — Ambulatory Visit: Payer: Medicare HMO | Admitting: Orthopaedic Surgery

## 2021-12-16 ENCOUNTER — Encounter: Payer: Self-pay | Admitting: Orthopaedic Surgery

## 2021-12-16 ENCOUNTER — Ambulatory Visit (INDEPENDENT_AMBULATORY_CARE_PROVIDER_SITE_OTHER): Payer: Medicare HMO

## 2021-12-16 DIAGNOSIS — M25511 Pain in right shoulder: Secondary | ICD-10-CM | POA: Diagnosis not present

## 2021-12-16 NOTE — Progress Notes (Signed)
Office Visit Note   Patient: Chelsea Beasley           Date of Birth: 08-10-1950           MRN: [id number] Visit Date: 12/16/2021              Requested by: Isaac Bliss, Rayford Halsted, MD [place],  [place] [address] PCP: Isaac Bliss, Rayford Halsted, MD   Assessment & Plan: Visit Diagnoses:  1. Acute pain of right shoulder     Plan: Impression is ganglion cyst of the right AC joint.  We will make a referral to Dr. Georgina Snell for ultrasound-guided aspiration and cortisone injection.  Follow-up as needed.  Follow-Up Instructions: No follow-ups on file.   Orders:  Orders Placed This Encounter  Procedures   XR Shoulder Right   Ambulatory referral to Orthopedic Surgery   No orders of the defined types were placed in this encounter.     Procedures: No procedures performed   Clinical Data: No additional findings.   Subjective: Chief Complaint  Patient presents with   Right Shoulder - Pain    HPI Chelsea Beasley returns today for evaluation of mass that appeared over the right shoulder about 6 weeks ago.  Denies definite injuries or traumas.  Denies any numbness and tingling. Review of Systems  Constitutional: Negative.   HENT: Negative.    Eyes: Negative.   Respiratory: Negative.    Cardiovascular: Negative.   Endocrine: Negative.   Musculoskeletal: Negative.   Neurological: Negative.   Hematological: Negative.   Psychiatric/Behavioral: Negative.    All other systems reviewed and are negative.   Objective: Vital Signs: LMP 01/27/2005 (Approximate)   Physical Exam Vitals and nursing note reviewed.  Constitutional:      Appearance: She is well-developed.  Pulmonary:     Effort: Pulmonary effort is normal.  Skin:    General: Skin is warm.     Capillary Refill: Capillary refill takes less than 2 seconds.  Neurological:     Mental Status: She is alert and oriented to person, place, and time.  Psychiatric:        Behavior: Behavior normal.         Thought Content: Thought content normal.        Judgment: Judgment normal.    Ortho Exam Examination of the right shoulder shows palpable firm mass superior to the [facility] joint that is tender to palpation.  There is no skin changes or warmth or any indication of infection.  Manual muscle testing of the shoulder is normal. Specialty Comments:  No specialty comments available.  Imaging: XR Shoulder Right  Result Date: 12/16/2021 Mild arthritic changes of the [facility] joint.  Small rounded ossicle within the [facility] joint.    PMFS History: Patient Active Problem List   Diagnosis Date Noted   Acute medial meniscus tear, left, initial encounter 05/22/2021   Vitamin D deficiency 01/13/2019   Routine general medical examination at a health care facility 12/23/2017   Chest pain 12/23/2017   Dryness of vagina 11/13/2013   VERRUCA VULGARIS 10/23/2008   Anemia 09/24/2008   MASTECTOMY, LEFT, HX OF 09/24/2007   Past Medical History:  Diagnosis Date   Anemia    Cancer ([place])    breast   Carpal tunnel syndrome on right    Heart murmur     Family History  Problem Relation Age of Onset   Hypertension Mother    Cancer Mother        lung  Hypertension Sister    Colon cancer Neg Hx    Esophageal cancer Neg Hx    Stomach cancer Neg Hx    Rectal cancer Neg Hx     Past Surgical History:  Procedure Laterality Date   APPENDECTOMY     BREAST BIOPSY Left 05/20/2005   BREAST SURGERY     left, total, 2006, cancer, no reconstruction   CARPAL TUNNEL RELEASE Right    MASTECTOMY Left 06/04/2005   NOSE SURGERY     Social History   Occupational History    Employer: [employer]    Comment: IT sales professional  Tobacco Use   Smoking status: Never   Smokeless tobacco: Never  Substance and Sexual Activity   Alcohol use: Yes    Comment: Occasional beer   Drug use: No   Sexual activity: Not on file

## 2022-01-05 NOTE — Progress Notes (Signed)
I, Wendy Poet, LAT, ATC, am serving as scribe for Dr. Lynne Leader.  Chelsea Beasley is a 71 y.o. female who presents to [place] [facility] today for R shoulder pain thought to be due a ganglion cyst at his R ACJ per Dr. Erlinda Hong.  She was last seen by Dr. Georgina Snell on 08/08/20 for her L knee.  Since then, pt has seen Dr. Erlinda Hong and had a L knee menisectomy and also for her R shoulder.  She states that the cyst/lump has been getting bigger over the last 3-4 months.  Pt locates pain to her R ACJ .  Swelling: yes Aggravating factors: pressure to the area Treatments tried: nothing  Diagnostic testing: R shoulder XR- 12/16/21  Pertinent review of systems: No fevers or chills  Relevant historical information: History of a left mastectomy   Exam:  BP (!) 142/80 (BP Location: Left Arm, Patient Position: Sitting, Cuff Size: Normal)   Pulse (!) 58   Ht '5\' 1"'$  (1.549 m)   Wt 134 lb 12.8 oz (61.1 kg)   LMP 01/27/2005 (Approximate)   SpO2 97%   BMI 25.47 kg/m  General: Well Developed, well nourished, and in no acute distress.   MSK: Right shoulder nodule overlying right AC joint present.  Minimally tender to palpation    Lab and Radiology Results  Procedure: Real-time Ultrasound Guided aspiration and injection of right AC joint ganglion cyst Device: Philips Affiniti 50G Images permanently stored and available for review in PACS Ultrasound evaluation prior to aspiration reveals a large ganglion cyst overlying the AC joint and a degenerative appearing AC joint. Verbal informed consent obtained.  Discussed risks and benefits of procedure. Warned about infection, bleeding, hyperglycemia damage to structures among others. Patient expresses understanding and agreement Time-out conducted.   Noted no overlying erythema, induration, or other signs of local infection.   Skin prepped in a sterile fashion.   Local anesthesia: Topical Ethyl chloride.   With sterile technique and under  real time ultrasound guidance: 1 mL of lidocaine injected into subcutaneous tissue overlying right AC joint ganglion cyst. Skin sterilized with isopropyl alcohol. 18-gauge needle used to access the ganglion cyst and 1 mL of clear gelatinous material aspirated. Skin again sterilized with isopropyl alcohol and using ultrasound guidance 25-gauge needle was used to access the [facility] joint and ganglion cyst and 40 mg of Kenalog and 1 mL of Marcaine was injected into the ganglion cyst and AC joint . Fluid seen entering the joint capsule and ganglion cyst.   Completed without difficulty   Pain immediately resolved suggesting accurate placement of the medication.   Advised to call if fevers/chills, erythema, induration, drainage, or persistent bleeding.   Images permanently stored and available for review in the ultrasound unit.  Impression: Technically successful ultrasound guided injection.    Assessment and Plan: 71 y.o. female with right shoulder AC joint ganglion cyst aspirated and injected today.  Recheck back as needed.  If not improved would consider further diagnostic imaging including MRI for potential surgery options.  She can follow-up with myself or Dr. Erlinda Hong for this.      PDMP not reviewed this encounter. Orders Placed This Encounter  Procedures   [place] LIMITED JOINT SPACE STRUCTURES UP RIGHT(NO LINKED CHARGES)    Order Specific Question:   Reason for Exam (SYMPTOM  OR DIAGNOSIS REQUIRED)    Answer:   R shoulder pain    Order Specific Question:   Preferred imaging location?    Answer:  [place]   No orders of the defined types were placed in this encounter.    Discussed warning signs or symptoms. Please see discharge instructions. Patient expresses understanding.   The above documentation has been reviewed and is accurate and complete Lynne Leader, M.D.

## 2022-01-06 ENCOUNTER — Ambulatory Visit (INDEPENDENT_AMBULATORY_CARE_PROVIDER_SITE_OTHER): Payer: Medicare HMO | Admitting: Family Medicine

## 2022-01-06 ENCOUNTER — Ambulatory Visit: Payer: Self-pay

## 2022-01-06 ENCOUNTER — Encounter: Payer: Self-pay | Admitting: Family Medicine

## 2022-01-06 VITALS — BP 142/80 | HR 58 | Ht 61.0 in | Wt 134.8 lb

## 2022-01-06 DIAGNOSIS — M25511 Pain in right shoulder: Secondary | ICD-10-CM | POA: Diagnosis not present

## 2022-01-06 DIAGNOSIS — M6740 Ganglion, unspecified site: Secondary | ICD-10-CM | POA: Diagnosis not present

## 2022-01-06 NOTE — Patient Instructions (Signed)
Good to see you today.  You had a R shoulder (AC joint) aspiration/injection.  Call or go to the ER if you develop a large red swollen joint with extreme pain or oozing puss.   Follow-up as needed

## 2022-01-28 ENCOUNTER — Telehealth: Payer: Self-pay | Admitting: Internal Medicine

## 2022-01-28 NOTE — Telephone Encounter (Signed)
Pt is calling and needs rx for mastectomy supplies bra's etc fax to attn teresa [phone] and phone number to [facility] phone number is [phone]

## 2022-01-29 ENCOUNTER — Ambulatory Visit
Admission: RE | Admit: 2022-01-29 | Discharge: 2022-01-29 | Disposition: A | Discharge status: Home / Self Care | Payer: Medicare HMO | Source: Ambulatory Visit | Attending: Internal Medicine | Admitting: Internal Medicine

## 2022-01-29 DIAGNOSIS — Z1231 Encounter for screening mammogram for malignant neoplasm of breast: Secondary | ICD-10-CM | POA: Diagnosis not present

## 2022-01-29 NOTE — Telephone Encounter (Signed)
Spoke with Chelsea Beasley at [facility] and stated she already has a signed order for the patient's supplies and nothing further is needed.

## 2022-02-28 DIAGNOSIS — J069 Acute upper respiratory infection, unspecified: Secondary | ICD-10-CM | POA: Diagnosis not present

## 2022-02-28 DIAGNOSIS — Z20822 Contact with and (suspected) exposure to covid-19: Secondary | ICD-10-CM | POA: Diagnosis not present

## 2022-05-06 ENCOUNTER — Encounter: Payer: Medicare HMO | Admitting: Internal Medicine

## 2022-05-06 DIAGNOSIS — R053 Chronic cough: Secondary | ICD-10-CM | POA: Diagnosis not present

## 2022-05-06 DIAGNOSIS — K219 Gastro-esophageal reflux disease without esophagitis: Secondary | ICD-10-CM | POA: Diagnosis not present

## 2022-05-20 ENCOUNTER — Encounter: Payer: Medicare HMO | Admitting: Internal Medicine

## 2022-05-27 ENCOUNTER — Encounter (HOSPITAL_COMMUNITY): Payer: Self-pay | Admitting: Pharmacy Technician

## 2022-05-27 ENCOUNTER — Emergency Department (HOSPITAL_COMMUNITY): Payer: Medicare HMO

## 2022-05-27 ENCOUNTER — Emergency Department (HOSPITAL_COMMUNITY)
Admission: EM | Admit: 2022-05-27 | Discharge: 2022-05-28 | Disposition: A | Discharge status: Home / Self Care | Payer: Medicare HMO | Attending: Emergency Medicine | Admitting: Emergency Medicine

## 2022-05-27 ENCOUNTER — Other Ambulatory Visit: Payer: Self-pay

## 2022-05-27 DIAGNOSIS — R519 Headache, unspecified: Secondary | ICD-10-CM | POA: Insufficient documentation

## 2022-05-27 DIAGNOSIS — R0789 Other chest pain: Secondary | ICD-10-CM | POA: Diagnosis not present

## 2022-05-27 DIAGNOSIS — R030 Elevated blood-pressure reading, without diagnosis of hypertension: Secondary | ICD-10-CM | POA: Diagnosis not present

## 2022-05-27 DIAGNOSIS — R0602 Shortness of breath: Secondary | ICD-10-CM | POA: Diagnosis not present

## 2022-05-27 DIAGNOSIS — R059 Cough, unspecified: Secondary | ICD-10-CM | POA: Diagnosis not present

## 2022-05-27 DIAGNOSIS — I10 Essential (primary) hypertension: Secondary | ICD-10-CM | POA: Diagnosis not present

## 2022-05-27 DIAGNOSIS — Z853 Personal history of malignant neoplasm of breast: Secondary | ICD-10-CM | POA: Insufficient documentation

## 2022-05-27 DIAGNOSIS — R079 Chest pain, unspecified: Secondary | ICD-10-CM | POA: Diagnosis not present

## 2022-05-27 LAB — CBC
HCT: 34.2 % — ABNORMAL LOW (ref 36.0–46.0)
Hemoglobin: 11.2 g/dL — ABNORMAL LOW (ref 12.0–15.0)
MCH: 29.5 pg (ref 26.0–34.0)
MCHC: 32.7 g/dL (ref 30.0–36.0)
MCV: 90 fL (ref 80.0–100.0)
Platelets: 265 10*3/uL (ref 150–400)
RBC: 3.8 MIL/uL — ABNORMAL LOW (ref 3.87–5.11)
RDW: 12.1 % (ref 11.5–15.5)
WBC: 7.3 10*3/uL (ref 4.0–10.5)
nRBC: 0 % (ref 0.0–0.2)

## 2022-05-27 LAB — TROPONIN I (HIGH SENSITIVITY)
Troponin I (High Sensitivity): 4 ng/L (ref <18)
Troponin I (High Sensitivity): 6 ng/L (ref <18)

## 2022-05-27 LAB — BASIC METABOLIC PANEL
Anion gap: 11 (ref 5–15)
BUN: 13 mg/dL (ref 8–23)
CO2: 26 mmol/L (ref 22–32)
Calcium: 9.7 mg/dL (ref 8.9–10.3)
Chloride: 104 mmol/L (ref 98–111)
Creatinine, Ser: 0.95 mg/dL (ref 0.44–1.00)
GFR, Estimated: >60 mL/min (ref >60)
Glucose, Bld: 89 mg/dL (ref 70–99)
Potassium: 3.8 mmol/L (ref 3.5–5.1)
Sodium: 141 mmol/L (ref 135–145)

## 2022-05-27 LAB — D-DIMER, QUANTITATIVE: D-Dimer, Quant: 0.42 ug/mL-FEU (ref 0.00–0.50)

## 2022-05-27 MED ORDER — IOHEXOL 350 MG/ML SOLN
75.0000 mL | Freq: Once | INTRAVENOUS | Status: AC | PRN
  Administered 2022-05-27: 75 mL via INTRAVENOUS

## 2022-05-27 MED ORDER — ALUM & MAG HYDROXIDE-SIMETH 200-200-20 MG/5ML PO SUSP
30.0000 mL | Freq: Once | ORAL | Status: AC
  Administered 2022-05-27: 30 mL via ORAL
  Filled 2022-05-27: qty 30

## 2022-05-27 MED ORDER — AMLODIPINE BESYLATE 5 MG PO TABS: 5.0000 mg | ORAL_TABLET | Freq: Every day | ORAL | 0 refills | Status: DC

## 2022-05-27 MED ORDER — METOCLOPRAMIDE HCL 5 MG/ML IJ SOLN
10.0000 mg | Freq: Once | INTRAMUSCULAR | Status: AC
  Administered 2022-05-27: 10 mg via INTRAVENOUS
  Filled 2022-05-27: qty 2

## 2022-05-27 MED ORDER — OMEPRAZOLE 20 MG PO CPDR: 20.0000 mg | DELAYED_RELEASE_CAPSULE | Freq: Every day | ORAL | 0 refills | Status: DC

## 2022-05-27 MED ORDER — KETOROLAC TROMETHAMINE 15 MG/ML IJ SOLN
15.0000 mg | Freq: Once | INTRAMUSCULAR | Status: AC
  Administered 2022-05-27: 15 mg via INTRAVENOUS
  Filled 2022-05-27: qty 1

## 2022-05-27 MED ORDER — DIPHENHYDRAMINE HCL 50 MG/ML IJ SOLN
12.5000 mg | Freq: Once | INTRAMUSCULAR | Status: AC
  Administered 2022-05-27: 12.5 mg via INTRAVENOUS
  Filled 2022-05-27: qty 1

## 2022-05-27 NOTE — ED Triage Notes (Signed)
Pt here POV with cough since April. Pt now with chest pain and shob onset approx 2 weeks ago with worsening over the last few days. Diarrhea X3 days. Hypertensive in triage with no history of same.

## 2022-05-27 NOTE — ED Notes (Signed)
Patient taken to CT at this time.

## 2022-05-27 NOTE — Discharge Instructions (Addendum)
There is no evidence of heart attack or blood clot in the lung.  follow-up with your doctor for further evaluation and likely a stress test.  Take the acid reflux medication as prescribed.  Avoid alcohol, caffeine, NSAID medications and spicy foods.  Your blood pressure today was elevated and should keep a record of this and follow-up with your doctor for blood pressure and medication adjustments. CT scan did show a nodule on your left adrenal gland which needs further evaluation with MRI. Return the ED with exertional chest pain, pain associate with shortness of breath, nausea, vomiting, sweating, other concerns.

## 2022-05-27 NOTE — ED Provider Triage Note (Signed)
[facility]  Chelsea Beasley , a 71 y.o. female  was evaluated in triage.  Pt complains of cough, chest pain and shortness of breath.  Patient reports cough has been ongoing since April.  Patient reports that she has been prescribed medication for this, has also been told it might be GERD.  The patient denies any antiacid medications.  The patient is also complaining of chest pain and shortness of breath that been ongoing for the last 1 week.  Patient denies any history of hypertension however is very hypertensive in triage.  Patient denies any headache, blurred vision.  Review of Systems  Positive:  Negative:   Physical Exam  BP (!) 202/96 (BP Location: Right Arm)   Pulse 73   Temp 99 F (37.2 C) (Oral)   Resp 16   LMP 01/27/2005 (Approximate)   SpO2 99%  Gen:   Awake, no distress   Resp:  Normal effort  MSK:   Moves extremities without difficulty  Other:    Medical Decision Making  Medically screening exam initiated at 5:49 PM.  Appropriate orders placed.  Chelsea Beasley was informed that the remainder of the evaluation will be completed by another provider, this initial triage assessment does not replace that evaluation, and the importance of remaining in the ED until their evaluation is complete.     Azucena Cecil, PA-C 05/27/22 1750

## 2022-05-27 NOTE — ED Provider Notes (Signed)
[facility] EMERGENCY DEPARTMENT Provider Note   CSN: [id number] Arrival date & time: 05/27/22  1733     History  No chief complaint on file.   Chelsea Beasley is a 71 y.o. female.  Patient with a history of anemia, previous breast cancer, heart murmur presenting with 2 weeks of constant chest pain worse with lying down.  She describes pain and pressure in the center of her chest that is constant at all times.  Does not radiate.  Associate with a cough that she has had since April.  She has been seen and treated for acid reflux but she does not believe it is getting any better.  Comes in today because her pain has been constant more severe and she is still coughing.  Not able to cough up anything.  No fever.  No abdominal pain, nausea or vomiting.  No leg pain or leg swelling.  Blood pressure is elevated.  She reports no history of hypertension.  She does not take any medications on a regular basis.  She has never had a stress test.  She states the pain has been constant for the past 2 weeks and never goes away but is worse when she tries to lie down to  Also complains of diffuse gradual onset headache for the past several weeks as well.  No thunderclap onset.  No focal weakness, numbness or tingling.  No visual changes.  The history is provided by the patient and a relative.       Home Medications Prior to Admission medications   Medication Sig Start Date End Date Taking? Authorizing Provider  Multiple Vitamins-Calcium (ONE-A-DAY WOMENS PO) Take by mouth daily.    [provider]      Allergies    Patient has no known allergies.    Review of Systems   Review of Systems  Constitutional:  Negative for activity change, appetite change and fever.  HENT:  Negative for congestion.   Respiratory:  Positive for chest tightness. Negative for shortness of breath.   Cardiovascular:  Positive for chest pain.  Genitourinary:  Negative for dysuria and hematuria.   Neurological:  Positive for headaches. Negative for weakness.    Physical Exam Updated Vital Signs BP (!) 180/76   Pulse 75   Temp 99 F (37.2 C) (Oral)   Resp 16   LMP 01/27/2005 (Approximate)   SpO2 100%  Physical Exam Vitals and nursing note reviewed.  Constitutional:      General: She is not in acute distress.    Appearance: She is well-developed. She is not ill-appearing.  HENT:     Head: Normocephalic and atraumatic.     Mouth/Throat:     Pharynx: No oropharyngeal exudate.  Eyes:     Conjunctiva/sclera: Conjunctivae normal.     Pupils: Pupils are equal, round, and reactive to light.  Neck:     Comments: No meningismus. Cardiovascular:     Rate and Rhythm: Normal rate and regular rhythm.     Heart sounds: Normal heart sounds. No murmur heard. Pulmonary:     Effort: Pulmonary effort is normal. No respiratory distress.     Breath sounds: Normal breath sounds.  Chest:     Chest wall: No tenderness.  Abdominal:     Palpations: Abdomen is soft.     Tenderness: There is no abdominal tenderness. There is no guarding or rebound.  Musculoskeletal:        General: No tenderness. Normal range of motion.  Cervical back: Normal range of motion and neck supple.  Skin:    General: Skin is warm.  Neurological:     Mental Status: She is alert and oriented to person, place, and time.     Cranial Nerves: No cranial nerve deficit.     Motor: No abnormal muscle tone.     Coordination: Coordination normal.     Comments: CN 2-12 intact, no ataxia on finger to nose, no nystagmus, 5/5 strength throughout, no pronator drift, Romberg negative, normal gait.   Psychiatric:        Behavior: Behavior normal.     ED Results / Procedures / Treatments   Labs (all labs ordered are listed, but only abnormal results are displayed) Labs Reviewed  CBC - Abnormal; Notable for the following components:      Result Value   RBC 3.80 (*)    Hemoglobin 11.2 (*)    HCT 34.2 (*)    All  other components within normal limits  BASIC METABOLIC PANEL  D-DIMER, QUANTITATIVE  TROPONIN I (HIGH SENSITIVITY)  TROPONIN I (HIGH SENSITIVITY)  TROPONIN I (HIGH SENSITIVITY)    EKG EKG Interpretation  Date/Time:  Wednesday May 27 2022 17:34:39 EDT Ventricular Rate:  68 PR Interval:  142 QRS Duration: 74 QT Interval:  384 QTC Calculation: 408 R Axis:   -39 Text Interpretation: Normal sinus rhythm Left axis deviation Nonspecific T wave abnormality Abnormal ECG When compared with ECG of 10-Feb-2020 17:01, PREVIOUS ECG IS PRESENT No significant change was found Confirmed by Ezequiel Essex [phone]) on 05/27/2022 6:50:10 PM  Radiology CT Angio Chest Aorta W and/or Wo Contrast  Result Date: 05/27/2022 CLINICAL DATA:  Acute aortic syndrome (AAS) suspected. Chest pain, shortness of breath EXAM: CT ANGIOGRAPHY CHEST WITH CONTRAST TECHNIQUE: Multidetector CT imaging of the chest was performed using the standard protocol during bolus administration of intravenous contrast. Multiplanar CT image reconstructions and MIPs were obtained to evaluate the vascular anatomy. RADIATION DOSE REDUCTION: This exam was performed according to the departmental dose-optimization program which includes automated exposure control, adjustment of the mA and/or kV according to patient size and/or use of iterative reconstruction technique. CONTRAST:  7m OMNIPAQUE IOHEXOL 350 MG/ML SOLN COMPARISON:  1136 FINDINGS: Cardiovascular: No filling defects in the pulmonary arteries to suggest pulmonary emboli. Heart is normal size. Aorta is normal caliber. No evidence of aortic dissection. Mediastinum/Nodes: No mediastinal, hilar, or axillary adenopathy. Trachea and esophagus are unremarkable. Thyroid unremarkable. Lungs/Pleura: No confluent opacities or effusions. Upper Abdomen: Nodule in the left adrenal gland measuring 2.3 cm, difficult to characterize on this study. Musculoskeletal: Prior left mastectomy. Chest wall soft  tissues are unremarkable. No acute bony abnormality. Review of the MIP images confirms the above findings. IMPRESSION: No evidence of aortic aneurysm, dissection or pulmonary embolus. No acute cardiopulmonary disease. Nonspecific left adrenal nodule. Density of 16 Hounsfield units suggests but is not diagnostic of an adenoma. This could be further evaluated with elective outpatient noncontrast CT or adrenal protocol MRI. Electronically Signed   By: KRolm BaptiseM.D.   On: 05/27/2022 23:07   CT Head Wo Contrast  Result Date: 05/27/2022 CLINICAL DATA:  Headache, new or worsening (Age >= 50y) EXAM: CT HEAD WITHOUT CONTRAST TECHNIQUE: Contiguous axial images were obtained from the base of the skull through the vertex without intravenous contrast. RADIATION DOSE REDUCTION: This exam was performed according to the departmental dose-optimization program which includes automated exposure control, adjustment of the mA and/or kV according to patient size and/or use of iterative  reconstruction technique. COMPARISON:  02/05/2010 FINDINGS: Brain: No acute intracranial abnormality. Specifically, no hemorrhage, hydrocephalus, mass lesion, acute infarction, or significant intracranial injury. Vascular: No hyperdense vessel or unexpected calcification. Skull: No acute calvarial abnormality. Sinuses/Orbits: No acute findings Other: None IMPRESSION: No acute intracranial abnormality. Electronically Signed   By: Rolm Baptise M.D.   On: 05/27/2022 23:01   DG Chest 1 View  Result Date: 05/27/2022 CLINICAL DATA:  Chest pain, cough EXAM: CHEST  1 VIEW COMPARISON:  02/10/2020 FINDINGS: Cardiac size is within normal limits. Thoracic aorta is ectatic. There are no signs of pulmonary edema or focal pulmonary consolidation. There is no pleural effusion or pneumothorax. IMPRESSION: No active disease. Electronically Signed   By: Elmer Picker M.D.   On: 05/27/2022 18:26    Procedures Procedures    Medications Ordered in  ED Medications - No data to display  ED Course/ Medical Decision Making/ A&P                           Medical Decision Making Amount and/or Complexity of Data Reviewed Labs: ordered. Decision-making details documented in ED Course. Radiology: ordered and independent interpretation performed. Decision-making details documented in ED Course. ECG/medicine tests: ordered and independent interpretation performed. Decision-making details documented in ED Course.  Risk OTC drugs. Prescription drug management.  2 weeks of constant chest pain associated with cough and shortness of breath.  Hypertensive on arrival.  EKG does not show any acute ST changes but shows nonspecific T wave changes similar to previous.  Low suspicion for ACS given ongoing pain for several weeks.  Consider aortic dissection, consider pulmonary embolism  Labs reassuring, troponin negative x2.  D-dimer is negative.  Low suspicion for ACS or pulmonary embolism.  Blood pressure has spontaneous improved to 154 systolic.  Does not take any home blood pressure medications.  Doubt ACS with negative heart enzymes with 2 weeks of constant pain.  Suspect pain GI in origin.  Will initiate PPI, avoid alcohol, caffeine, NSAID medications, spicy foods  CT head shows no hemorrhage.  CTA shows no aortic aneurysm, dissection or pulmonary embolism.  Blood pressure remains elevated in the 180s.  We will start low-dose amlodipine.  No evidence of endorgan damage.  Troponin negative x2 with low suspicion for ACS.  Follow-up with PCP for stress test as well as further medication adjustments for blood pressure.  Patient made aware of adrenal nodule and need for MRI.  Return to the ED with exertional chest pain, pain associate with shortness of breath, nausea, vomiting, sweating, any other concerns        Final Clinical Impression(s) / ED Diagnoses Final diagnoses:  Atypical chest pain  Elevated blood pressure reading    Rx / DC  Orders ED Discharge Orders     None         Rishith Siddoway, Annie Main, MD 05/27/22 2340

## 2022-05-28 NOTE — ED Notes (Signed)
Patient reports improvement in symptoms after medications

## 2022-06-03 ENCOUNTER — Telehealth: Payer: Self-pay | Admitting: Licensed Clinical Social Worker

## 2022-06-03 ENCOUNTER — Encounter: Payer: Self-pay | Admitting: Internal Medicine

## 2022-06-03 ENCOUNTER — Ambulatory Visit (INDEPENDENT_AMBULATORY_CARE_PROVIDER_SITE_OTHER): Payer: Medicare HMO | Admitting: Internal Medicine

## 2022-06-03 ENCOUNTER — Telehealth: Payer: Self-pay

## 2022-06-03 VITALS — BP 147/81 | HR 78 | Temp 98.6°F | Ht 65.0 in | Wt 137.5 lb

## 2022-06-03 DIAGNOSIS — E559 Vitamin D deficiency, unspecified: Secondary | ICD-10-CM | POA: Diagnosis not present

## 2022-06-03 DIAGNOSIS — Z13820 Encounter for screening for osteoporosis: Secondary | ICD-10-CM

## 2022-06-03 DIAGNOSIS — Z0000 Encounter for general adult medical examination without abnormal findings: Secondary | ICD-10-CM | POA: Diagnosis not present

## 2022-06-03 DIAGNOSIS — M25562 Pain in left knee: Secondary | ICD-10-CM

## 2022-06-03 DIAGNOSIS — S83242A Other tear of medial meniscus, current injury, left knee, initial encounter: Secondary | ICD-10-CM | POA: Diagnosis not present

## 2022-06-03 DIAGNOSIS — I10 Essential (primary) hypertension: Secondary | ICD-10-CM | POA: Diagnosis not present

## 2022-06-03 DIAGNOSIS — Z23 Encounter for immunization: Secondary | ICD-10-CM | POA: Diagnosis not present

## 2022-06-03 MED ORDER — VALSARTAN-HYDROCHLOROTHIAZIDE 160-25 MG PO TABS: 1.0000 | ORAL_TABLET | Freq: Every day | ORAL | 3 refills | Status: DC

## 2022-06-03 NOTE — Patient Outreach (Signed)
  Care Coordination   06/03/2022 Name: Chelsea Beasley MRN: [id number] DOB: [birth year]   Care Coordination Outreach Attempts:  An unsuccessful telephone outreach was attempted today to offer the patient information about available care coordination services as a benefit of their health plan.   Follow Up Plan:  Additional outreach attempts will be made to offer the patient care coordination information and services.   Encounter Outcome:  No Answer  Care Coordination Interventions Activated:  No   Care Coordination Interventions:  No, not indicated    Christa See, MSW, [email or web address] Phone [phone] 11:32 AM

## 2022-06-03 NOTE — Progress Notes (Signed)
Established Patient Office Visit     CC/Reason for Visit: Annual preventive exam and subsequent Medicare wellness visit  HPI: Chelsea Beasley is a 71 y.o. female who is coming in today for the above mentioned reasons. Past Medical History is significant for: GERD, vitamin D deficiency and history of breast cancer.  She had left meniscus repair last year and has continued to have pain.  She has routine eye care, is overdue for dental exam, no perceived hearing issues, she is due for flu, COVID, shingles vaccines.  She was seen in the ED recently for a cough and was prescribed omeprazole and amlodipine for hypertension.  She discontinued the amlodipine after only a few days as she believed it caused dizziness and drowsiness.   Past Medical/Surgical History: Past Medical History:  Diagnosis Date   Anemia    Cancer ([place])    breast   Carpal tunnel syndrome on right    Heart murmur     Past Surgical History:  Procedure Laterality Date   APPENDECTOMY     BREAST BIOPSY Left 05/20/2005   BREAST SURGERY     left, total, 2006, cancer, no reconstruction   CARPAL TUNNEL RELEASE Right    MASTECTOMY Left 06/04/2005   NOSE SURGERY      Social History:  reports that she has never smoked. She has never used smokeless tobacco. She reports current alcohol use. She reports that she does not use drugs.  Allergies: No Known Allergies  Family History:  Family History  Problem Relation Age of Onset   Hypertension Mother    Cancer Mother        lung   Hypertension Sister    Colon cancer Neg Hx    Esophageal cancer Neg Hx    Stomach cancer Neg Hx    Rectal cancer Neg Hx      Current Outpatient Medications:    Multiple Vitamins-Calcium (ONE-A-DAY WOMENS PO), Take by mouth daily., Disp: , Rfl:    omeprazole (PRILOSEC) 20 MG capsule, Take 1 capsule (20 mg total) by mouth daily., Disp: 30 capsule, Rfl: 0   valsartan-hydrochlorothiazide (DIOVAN-HCT) 160-25 MG tablet, Take 1 tablet by  mouth daily., Disp: 90 tablet, Rfl: 3  Review of Systems:  Constitutional: Denies fever, chills, diaphoresis, appetite change and fatigue.  HEENT: Denies photophobia, eye pain, redness, hearing loss, ear pain, congestion, sore throat, rhinorrhea, sneezing, mouth sores, trouble swallowing, neck pain, neck stiffness and tinnitus.   Respiratory: Denies SOB, DOE,  chest tightness,  and wheezing.   Cardiovascular: Denies chest pain, palpitations and leg swelling.  Gastrointestinal: Denies nausea, vomiting, abdominal pain, diarrhea, constipation, blood in stool and abdominal distention.  Genitourinary: Denies dysuria, urgency, frequency, hematuria, flank pain and difficulty urinating.  Endocrine: Denies: hot or cold intolerance, sweats, changes in hair or nails, polyuria, polydipsia. Musculoskeletal: Denies myalgias, back pain. Skin: Denies pallor, rash and wound.  Neurological: Denies dizziness, seizures, syncope, weakness, light-headedness, numbness and headaches.  Hematological: Denies adenopathy. Easy bruising, personal or family bleeding history  Psychiatric/Behavioral: Denies suicidal ideation, mood changes, confusion, nervousness, sleep disturbance and agitation    Physical Exam: Vitals:   06/03/22 1445 06/03/22 1454  BP: (!) 140/90 (!) 147/81  Pulse: 78   Temp: 98.6 F (37 C)   TempSrc: Oral   SpO2: 98%   Weight: 137 lb 8 oz (62.4 kg)   Height: '5\' 5"'$  (1.651 m)     Body mass index is 22.88 kg/m.   Constitutional: NAD, calm, comfortable Eyes:  PERRL, lids and conjunctivae normal ENMT: Mucous membranes are moist. Posterior pharynx clear of any exudate or lesions. Normal dentition. Tympanic membrane is pearly white, no erythema or bulging. Neck: normal, supple, no masses, no thyromegaly Respiratory: clear to auscultation bilaterally, no wheezing, no crackles. Normal respiratory effort. No accessory muscle use.  Cardiovascular: Regular rate and rhythm, no murmurs / rubs / gallops.  No extremity edema. 2+ pedal pulses. No carotid bruits.  Abdomen: no tenderness, no masses palpated. No hepatosplenomegaly. Bowel sounds positive.  Musculoskeletal: no clubbing / cyanosis. No joint deformity upper and lower extremities. Good ROM, no contractures. Normal muscle tone.  Skin: no rashes, lesions, ulcers. No induration Neurologic: CN 2-12 grossly intact. Sensation intact, DTR normal. Strength 5/5 in all 4.  Psychiatric: Normal judgment and insight. Alert and oriented x 3. Normal mood.   Subsequent Medicare wellness visit   1. Risk factors, based on past  M,S,F -cardiovascular disease risk factors include age only and hypertension   2.  Physical activities: Rides her bicycle 3 days a week   3.  Depression/mood: Stable, not depressed   4.  Hearing: No perceived issues   5.  ADL's: Independent in all ADLs   6.  Fall risk: Low fall risk   7.  Home safety: No problems identified   8.  Height weight, and visual acuity: height and weight as above, vision:  Vision Screening   Right eye Left eye Both eyes  Without correction     With correction '20/20 20/20 20/20 '$     9.  Counseling: Advised to update all immunizations   10. Lab orders based on risk factors: Laboratory update will be reviewed   11. Referral : Orthopedics   12. Care plan: Follow-up with me in 6 to 8 weeks   13. Cognitive assessment: No cognitive impairment   14. Screening: Patient provided with a written and personalized 5-10 year screening schedule in the AVS. yes   15. Provider List Update: PCP, orthopedics  16. Advance Directives: Full code   17. Opioids: Patient is not on any opioid prescriptions and has no risk factors for a substance use disorder.   [place] Office Visit from 06/03/2022 in [facility] at [facility]  PHQ-9 Total Score 2          02/14/2020    3:05 PM 04/29/2021    2:53 PM 10/08/2021    2:28 PM 05/27/2022    5:42 PM 06/03/2022    2:44 PM  [place] in  the past year? 0 0 0  0  Was there an injury with Fall? 0 0 0  0  Fall Risk Category Calculator 0 0 0  0  Fall Risk Category Low Low Low  Low  Patient Fall Risk Level   Low fall risk Low fall risk Low fall risk  Patient at Risk for Falls Due to   No Fall Risks  No Fall Risks  Fall risk Follow up   Falls evaluation completed  Falls evaluation completed      Impression and Plan:  Encounter for preventive health examination - Plan: CBC with Differential/Platelet, Comprehensive metabolic panel, Lipid panel  Needs flu shot - Plan: Flu Vaccine QUAD High Dose(Fluad)  Acute medial meniscus tear, left, initial encounter - Plan: Ambulatory referral to Orthopedic Surgery  Left knee pain, unspecified chronicity - Plan: Ambulatory referral to Orthopedic Surgery  Vitamin D deficiency - Plan: VITAMIN D 25 Hydroxy (Vit-D Deficiency, Fractures)  Primary hypertension - Plan: valsartan-hydrochlorothiazide (DIOVAN-HCT)  160-25 MG tablet    -Recommend routine eye and dental care. -Immunizations: Flu vaccine in office today, advised COVID and shingles at pharmacy -Healthy lifestyle discussed in detail. -Labs to be updated today. -Colon cancer screening: 02/2018 -Breast cancer screening: 01/2022 -Cervical cancer screening: 12/2018 -Lung cancer screening: Not applicable -Prostate cancer screening: Not applicable -DEXA: Overdue, will request  -Due to her continued left knee pain I will refer back to orthopedics. -Blood pressure remains elevated, she discontinued amlodipine as per report it causes drowsiness, start Diovan HCT 160/25 mg and have her return in 6 to 8 weeks for follow-up.    Lelon Frohlich, MD [facility]

## 2022-06-03 NOTE — Telephone Encounter (Signed)
        Patient  visited [facility] on 11/2   Telephone encounter attempt :  1st  A HIPAA compliant voice message was left requesting a return call.  Instructed patient to call back   [facility]  [phone] [address], [place],  [address] Phone: [phone] Email: [email or web address]

## 2022-06-04 LAB — LIPID PANEL
Cholesterol: 181 mg/dL (ref 0–200)
HDL: 93.1 mg/dL (ref >39.00)
LDL Cholesterol: 80 mg/dL (ref 0–99)
NonHDL: 88.35
Total CHOL/HDL Ratio: 2
Triglycerides: 41 mg/dL (ref 0.0–149.0)
VLDL: 8.2 mg/dL (ref 0.0–40.0)

## 2022-06-04 LAB — COMPREHENSIVE METABOLIC PANEL
ALT: 14 U/L (ref 0–35)
AST: 21 U/L (ref 0–37)
Albumin: 4.3 g/dL (ref 3.5–5.2)
Alkaline Phosphatase: 55 U/L (ref 39–117)
BUN: 16 mg/dL (ref 6–23)
CO2: 28 mEq/L (ref 19–32)
Calcium: 9.5 mg/dL (ref 8.4–10.5)
Chloride: 102 mEq/L (ref 96–112)
Creatinine, Ser: 0.68 mg/dL (ref 0.40–1.20)
GFR: 88.61 mL/min (ref >60.00)
Glucose, Bld: 63 mg/dL — ABNORMAL LOW (ref 70–99)
Potassium: 4 mEq/L (ref 3.5–5.1)
Sodium: 138 mEq/L (ref 135–145)
Total Bilirubin: 0.7 mg/dL (ref 0.2–1.2)
Total Protein: 7.2 g/dL (ref 6.0–8.3)

## 2022-06-04 LAB — CBC WITH DIFFERENTIAL/PLATELET
Basophils Absolute: 0 10*3/uL (ref 0.0–0.1)
Basophils Relative: 0.6 % (ref 0.0–3.0)
Eosinophils Absolute: 0.1 10*3/uL (ref 0.0–0.7)
Eosinophils Relative: 0.8 % (ref 0.0–5.0)
HCT: 33.4 % — ABNORMAL LOW (ref 36.0–46.0)
Hemoglobin: 10.9 g/dL — ABNORMAL LOW (ref 12.0–15.0)
Lymphocytes Relative: 29.3 % (ref 12.0–46.0)
Lymphs Abs: 2.1 10*3/uL (ref 0.7–4.0)
MCHC: 32.5 g/dL (ref 30.0–36.0)
MCV: 89 fl (ref 78.0–100.0)
Monocytes Absolute: 0.4 10*3/uL (ref 0.1–1.0)
Monocytes Relative: 5.6 % (ref 3.0–12.0)
Neutro Abs: 4.5 10*3/uL (ref 1.4–7.7)
Neutrophils Relative %: 63.7 % (ref 43.0–77.0)
Platelets: 231 10*3/uL (ref 150.0–400.0)
RBC: 3.75 Mil/uL — ABNORMAL LOW (ref 3.87–5.11)
RDW: 12.7 % (ref 11.5–15.5)
WBC: 7.1 10*3/uL (ref 4.0–10.5)

## 2022-06-04 LAB — VITAMIN D 25 HYDROXY (VIT D DEFICIENCY, FRACTURES): VITD: 22.74 ng/mL — ABNORMAL LOW (ref 30.00–100.00)

## 2022-06-04 NOTE — Patient Instructions (Signed)
Visit Information  Thank you for taking time to visit with me today. Please don't hesitate to contact me if I can be of assistance to you.   Following are the goals we discussed today:   Goals Addressed             This Visit's Progress    COMPLETED: Care Coordination Activities-No Follow Up Required       Care Coordination Interventions: Solution-Focused Strategies employed:  Active listening / Reflection utilized  Emotional Support Provided Verbalization of feelings encouraged  Pt has an appt scheduled with LBP at 3 PM for ED f/up Patient reports chronic cough and had a surgery on leg last year. Back pain of the leg is continuous. Pt endorses difficulty sleeping, elevated blood pressure, and difficulty managing pain despite taking prescribed 800 mg ibuprofen Patient reports no symptoms of depression at this time LCSW reviewed upcoming appts LCSW identified pt strengths and encouraged continuous use of healthy coping skills LCSW informed patient of care coordination services. Pt is not interested at this time and agreed to contact PCP, should needs arise             If you are experiencing a Mental Health or [place] or need someone to talk to, please call the Suicide and Crisis Lifeline: 988 call 911   Patient verbalizes understanding of instructions and care plan provided today and agrees to view in [place]. Active MyChart status and patient understanding of how to access instructions and care plan via MyChart confirmed with patient.     No further follow up required:    Christa See, MSW, [email or web address] Phone [phone] 11:37 AM

## 2022-06-04 NOTE — Patient Outreach (Signed)
  Care Coordination   Initial Visit Note   06/04/2022 Name: Chelsea Beasley MRN: [id number] DOB: 03-13-1951  Chelsea Beasley is a 71 y.o. year old female who sees Isaac Bliss, Rayford Halsted, MD for primary care. I spoke with  [name] by phone today.  What matters to the patients health and wellness today?  Care Coordination    Goals Addressed             This Visit's Progress    COMPLETED: Care Coordination Activities-No Follow Up Required       Care Coordination Interventions: Solution-Focused Strategies employed:  Active listening / Reflection utilized  Emotional Support Provided Verbalization of feelings encouraged  Pt has an appt scheduled with LBP at 3 PM for ED f/up Patient reports chronic cough and had a surgery on leg last year. Back pain of the leg is continuous. Pt endorses difficulty sleeping, elevated blood pressure, and difficulty managing pain despite taking prescribed 800 mg ibuprofen Patient reports no symptoms of depression at this time LCSW reviewed upcoming appts LCSW identified pt strengths and encouraged continuous use of healthy coping skills LCSW informed patient of care coordination services. Pt is not interested at this time and agreed to contact PCP, should needs arise             SDOH assessments and interventions completed:  No     Care Coordination Interventions Activated:  Yes  Care Coordination Interventions:  Yes, provided   Follow up plan: No further intervention required.   Encounter Outcome:  Pt. Refused   Christa See, MSW, [email or web address] Phone [phone] 11:37 AM

## 2022-06-09 ENCOUNTER — Ambulatory Visit (INDEPENDENT_AMBULATORY_CARE_PROVIDER_SITE_OTHER): Payer: Medicare HMO

## 2022-06-09 ENCOUNTER — Encounter: Payer: Self-pay | Admitting: Internal Medicine

## 2022-06-09 ENCOUNTER — Other Ambulatory Visit: Payer: Self-pay | Admitting: Internal Medicine

## 2022-06-09 ENCOUNTER — Ambulatory Visit: Payer: Medicare HMO | Admitting: Orthopaedic Surgery

## 2022-06-09 ENCOUNTER — Encounter: Payer: Self-pay | Admitting: Orthopaedic Surgery

## 2022-06-09 DIAGNOSIS — D649 Anemia, unspecified: Secondary | ICD-10-CM

## 2022-06-09 DIAGNOSIS — M25562 Pain in left knee: Secondary | ICD-10-CM | POA: Diagnosis not present

## 2022-06-09 DIAGNOSIS — G8929 Other chronic pain: Secondary | ICD-10-CM

## 2022-06-09 DIAGNOSIS — E559 Vitamin D deficiency, unspecified: Secondary | ICD-10-CM

## 2022-06-09 DIAGNOSIS — R053 Chronic cough: Secondary | ICD-10-CM | POA: Diagnosis not present

## 2022-06-09 DIAGNOSIS — I959 Hypotension, unspecified: Secondary | ICD-10-CM | POA: Diagnosis not present

## 2022-06-09 MED ORDER — VITAMIN D (ERGOCALCIFEROL) 1.25 MG (50000 UNIT) PO CAPS: 50000.0000 [IU] | ORAL_CAPSULE | ORAL | 0 refills | Status: DC

## 2022-06-09 MED ORDER — PREDNISONE 10 MG (21) PO TBPK: ORAL_TABLET | ORAL | 3 refills | Status: DC

## 2022-06-09 NOTE — Progress Notes (Signed)
Office Visit Note   Patient: Chelsea Beasley           Date of Birth: 05/31/1951           MRN: [id number] Visit Date: 06/09/2022              Requested by: Chelsea Beasley, Chelsea Halsted, MD [place],  [place] [id number] PCP: Chelsea Beasley, Chelsea Halsted, MD   Assessment & Plan: Visit Diagnoses:  1. Chronic pain of left knee     Plan: Impression is lumbar radiculopathy.  We will start with a steroid Dosepak.  Follow-up if symptoms persist.  Follow-Up Instructions: No follow-ups on file.   Orders:  Orders Placed This Encounter  Procedures   XR KNEE 3 VIEW LEFT   Meds ordered this encounter  Medications   predniSONE (STERAPRED UNI-PAK 21 TAB) 10 MG (21) TBPK tablet    Sig: Take as directed    Dispense:  21 tablet    Refill:  3      Procedures: No procedures performed   Clinical Data: No additional findings.   Subjective: Chief Complaint  Patient presents with   Left Knee - Pain    HPI Chelsea Beasley returns today to be evaluated for lateral calf pain for about 4 to 5 months.  She states it wakes her up at night.  Reports no back pain.  She feels pain on the lateral side of the calf that radiates up into the hip. Review of Systems  Constitutional: Negative.   HENT: Negative.    Eyes: Negative.   Respiratory: Negative.    Cardiovascular: Negative.   Endocrine: Negative.   Musculoskeletal: Negative.   Neurological: Negative.   Hematological: Negative.   Psychiatric/Behavioral: Negative.    All other systems reviewed and are negative.    Objective: Vital Signs: LMP 01/27/2005 (Approximate)   Physical Exam Vitals and nursing note reviewed.  Constitutional:      Appearance: She is well-developed.  Pulmonary:     Effort: Pulmonary effort is normal.  Skin:    General: Skin is warm.     Capillary Refill: Capillary refill takes less than 2 seconds.  Neurological:     Mental Status: She is alert and oriented to person, place, and time.   Psychiatric:        Behavior: Behavior normal.        Thought Content: Thought content normal.        Judgment: Judgment normal.     Ortho Exam Exam nation of the left knee is unremarkable. Examination of the left hip is unremarkable. Specialty Comments:  No specialty comments available.  Imaging: XR KNEE 3 VIEW LEFT  Result Date: 06/09/2022 Greater than 50% joint space narrowing of the medial compartment.  Osteophytic spurring.    PMFS History: Patient Active Problem List   Diagnosis Date Noted   Ganglion cyst 01/06/2022   Acute medial meniscus tear, left, initial encounter 05/22/2021   Vitamin D deficiency 01/13/2019   Routine general medical examination at a Beasley care facility 12/23/2017   Chest pain 12/23/2017   Dryness of vagina 11/13/2013   VERRUCA VULGARIS 10/23/2008   Anemia 09/24/2008   MASTECTOMY, LEFT, HX OF 09/24/2007   Past Medical History:  Diagnosis Date   Anemia    Cancer ([place])    breast   Carpal tunnel syndrome on right    Heart murmur     Family History  Problem Relation Age of Onset   Hypertension Mother  Cancer Mother        lung   Hypertension Sister    Colon cancer Neg Hx    Esophageal cancer Neg Hx    Stomach cancer Neg Hx    Rectal cancer Neg Hx     Past Surgical History:  Procedure Laterality Date   APPENDECTOMY     BREAST BIOPSY Left 05/20/2005   BREAST SURGERY     left, total, 2006, cancer, no reconstruction   CARPAL TUNNEL RELEASE Right    MASTECTOMY Left 06/04/2005   NOSE SURGERY     Social History   Occupational History    Employer: [employer] [facility]    Comment: IT sales professional  Tobacco Use   Smoking status: Never   Smokeless tobacco: Never  Substance and Sexual Activity   Alcohol use: Yes    Comment: Occasional beer   Drug use: No   Sexual activity: Not on file

## 2022-06-10 ENCOUNTER — Ambulatory Visit: Payer: Self-pay | Admitting: Licensed Clinical Social Worker

## 2022-06-10 ENCOUNTER — Other Ambulatory Visit: Payer: Self-pay | Admitting: *Deleted

## 2022-06-10 DIAGNOSIS — E559 Vitamin D deficiency, unspecified: Secondary | ICD-10-CM

## 2022-06-12 NOTE — Patient Outreach (Signed)
  Care Coordination   Follow Up Visit Note   06/12/2022 Name: Chelsea Beasley MRN: [id number] DOB: 1950-12-17  Chelsea Beasley is a 71 y.o. year old female who sees Isaac Bliss, Rayford Halsted, MD for primary care. I spoke with  [name] by phone today.  What matters to the patients health and wellness today?  Management of HTN    Goals Addressed             This Visit's Progress    COMPLETED: Care Coordination Update-LCSW       Care Coordination Interventions: Solution-Focused Strategies employed:  Active listening / Reflection utilized  Emotional Support Provided Verbalization of feelings encouraged  Patient is prioritizing management of Blood Pressure Pt is taking a new medication for chronic cough. Reports no adverse side effects and notes it is effective in decreasing symptoms Pt was informed by provider that her previous surgery may have pinched a nerve. Predesone was prescribed for 2 weeks and is working well LCSW reviewed upcoming appts SDOH assessment completed. Pt has no new needs at this time         SDOH assessments and interventions completed:  Yes  SDOH Interventions Today    Flowsheet Row Most Recent Value  SDOH Interventions   Food Insecurity Interventions Intervention Not Indicated  Housing Interventions Intervention Not Indicated  Transportation Interventions Intervention Not Indicated        Care Coordination Interventions Activated:  Yes  Care Coordination Interventions:  Yes, provided   Follow up plan: No further intervention required.   Encounter Outcome:  Pt. Visit Completed   Christa See, MSW, [email or web address] Phone [phone] 6:10 AM

## 2022-06-12 NOTE — Patient Instructions (Signed)
Visit Information  Thank you for taking time to visit with me today. Please don't hesitate to contact me if I can be of assistance to you.   Following are the goals we discussed today:   Goals Addressed             This Visit's Progress    COMPLETED: Care Coordination Update-LCSW       Care Coordination Interventions: Solution-Focused Strategies employed:  Active listening / Reflection utilized  Emotional Support Provided Verbalization of feelings encouraged  Patient is prioritizing management of Blood Pressure Pt is taking a new medication for chronic cough. Reports no adverse side effects and notes it is effective in decreasing symptoms Pt was informed by provider that her previous surgery may have pinched a nerve. Predesone was prescribed for 2 weeks and is working well LCSW reviewed upcoming appts SDOH assessment completed. Pt has no new needs at this time        If you are experiencing a Mental Health or [place] or need someone to talk to, please call the Suicide and Crisis Lifeline: 988 call 911   Patient verbalizes understanding of instructions and care plan provided today and agrees to view in [place]. Active MyChart status and patient understanding of how to access instructions and care plan via MyChart confirmed with patient.     No further follow up required:    Christa See, MSW, [place] [facility] [email or web address] Phone [phone] 6:10 AM

## 2022-06-15 ENCOUNTER — Other Ambulatory Visit (INDEPENDENT_AMBULATORY_CARE_PROVIDER_SITE_OTHER): Payer: Medicare HMO

## 2022-06-15 DIAGNOSIS — D649 Anemia, unspecified: Secondary | ICD-10-CM | POA: Diagnosis not present

## 2022-06-15 LAB — VITAMIN B12: Vitamin B-12: 383 pg/mL (ref 211–911)

## 2022-06-16 LAB — IBC + FERRITIN
Ferritin: 59.9 ng/mL (ref 10.0–291.0)
Iron: 130 ug/dL (ref 42–145)
Saturation Ratios: 39.3 % (ref 20.0–50.0)
TIBC: 330.4 ug/dL (ref 250.0–450.0)
Transferrin: 236 mg/dL (ref 212.0–360.0)

## 2022-06-17 LAB — FOLATE RBC: RBC Folate: 656 ng/mL RBC (ref >280)

## 2022-06-23 NOTE — Progress Notes (Deleted)
Cardiology Office Note:    Date:  06/23/2022   ID:  Chelsea, Beasley 1950/11/09, MRN [id number]  PCP:  Chelsea Beasley, Chelsea Halsted, MD   [place] Providers Cardiologist:  None   Referring MD: Chelsea Essex, MD    History of Present Illness:    Chelsea Beasley is a 71 y.o. female with a hx of breast cancer and anemia who was referred by Dr. Wyvonnia Beasley for further evaluation of chest pain.  Patient seen in [facility] 06/02/2022. Note reviewed. Reported constant chest pressure with associated cough. ECG with NSR with nonspecific T wave changes. Trop negative x2. D-dimer negative. CTA 06-02-22 with no acute process.   Today, ***    Past Medical History:  Diagnosis Date   Anemia    Cancer ([place])    breast   Carpal tunnel syndrome on right    Heart murmur     Past Surgical History:  Procedure Laterality Date   APPENDECTOMY     BREAST BIOPSY Left 05/20/2005   BREAST SURGERY     left, total, 2006, cancer, no reconstruction   CARPAL TUNNEL RELEASE Right    MASTECTOMY Left 06/04/2005   NOSE SURGERY      Current Medications: No outpatient medications have been marked as taking for the 06/24/22 encounter (Appointment) with Chelsea Bergeron, MD.     Allergies:   Patient has no known allergies.   Social History   Socioeconomic History   Marital status: Divorced    Spouse name: Not on file   Number of children: 2   Years of education: Not on file   Highest education level: Not on file  Occupational History    Employer: [employer]    Comment: audit coordinator  Tobacco Use   Smoking status: Never   Smokeless tobacco: Never  Substance and Sexual Activity   Alcohol use: Yes    Comment: Occasional beer   Drug use: No   Sexual activity: Not on file  Other Topics Concern   Not on file  Social History Narrative   Not on file   Social Determinants of Health   Financial Resource Strain: Not on file  Food Insecurity: No Food Insecurity (06/10/2022)    Hunger Vital Sign    Worried About Running Out of Food in the Last Year: Never true    Ran Out of Food in the Last Year: Never true  Transportation Needs: No Transportation Needs (06/10/2022)   PRAPARE - Hydrologist (Medical): No    Lack of Transportation (Non-Medical): No  Physical Activity: Not on file  Stress: Not on file  Social Connections: Not on file     Family History: The patient's ***family history includes Cancer in her mother; Hypertension in her mother and sister. There is no history of Colon cancer, Esophageal cancer, Stomach cancer, or Rectal cancer.  ROS:   Please see the history of present illness.    *** All other systems reviewed and are negative.  EKGs/Labs/Other Studies Reviewed:    The following studies were reviewed today: CTA 06-02-2022: FINDINGS: Cardiovascular: No filling defects in the pulmonary arteries to suggest pulmonary emboli. Heart is normal size. Aorta is normal caliber. No evidence of aortic dissection.   Mediastinum/Nodes: No mediastinal, hilar, or axillary adenopathy. Trachea and esophagus are unremarkable. Thyroid unremarkable.   Lungs/Pleura: No confluent opacities or effusions.   Upper Abdomen: Nodule in the left adrenal gland measuring 2.3 cm, difficult to characterize on this study.  Musculoskeletal: Prior left mastectomy. Chest wall soft tissues are unremarkable. No acute bony abnormality.   Review of the MIP images confirms the above findings.   IMPRESSION: No evidence of aortic aneurysm, dissection or pulmonary embolus.   No acute cardiopulmonary disease.   Nonspecific left adrenal nodule. Density of 16 Hounsfield units suggests but is not diagnostic of an adenoma. This could be further evaluated with elective outpatient noncontrast CT or adrenal protocol MRI.  EKG:  EKG is *** ordered today.  The ekg ordered today demonstrates ***  Recent Labs: 06/03/2022: ALT 14; BUN 16; Creatinine, Ser  0.68; Hemoglobin 10.9; Platelets 231.0; Potassium 4.0; Sodium 138  Recent Lipid Panel    Component Value Date/Time   CHOL 181 06/03/2022 1523   TRIG 41.0 06/03/2022 1523   TRIG 55 05/04/2006 1029   HDL 93.10 06/03/2022 1523   CHOLHDL 2 06/03/2022 1523   VLDL 8.2 06/03/2022 1523   LDLCALC 80 06/03/2022 1523   LDLCALC 84 04/30/2020 1104   LDLDIRECT 112.0 11/03/2012 1506     Risk Assessment/Calculations:   {Does this patient have ATRIAL FIBRILLATION?:[phone]}  No BP recorded.  {Refresh Note OR Click here to enter BP  :1}***         Physical Exam:    VS:  LMP 01/27/2005 (Approximate)     Wt Readings from Last 3 Encounters:  06/03/22 137 lb 8 oz (62.4 kg)  01/06/22 134 lb 12.8 oz (61.1 kg)  10/08/21 134 lb 3.2 oz (60.9 kg)     GEN: *** Well nourished, well developed in no acute distress HEENT: Normal NECK: No JVD; No carotid bruits LYMPHATICS: No lymphadenopathy CARDIAC: ***RRR, no murmurs, rubs, gallops RESPIRATORY:  Clear to auscultation without rales, wheezing or rhonchi  ABDOMEN: Soft, non-tender, non-distended MUSCULOSKELETAL:  No edema; No deformity  SKIN: Warm and dry NEUROLOGIC:  Alert and oriented x 3 PSYCHIATRIC:  Normal affect   ASSESSMENT:    No diagnosis found. PLAN:    In order of problems listed above:  #Atypical Chest Pain: Reassuring cardiac work-up in ER as detailed above. Symptoms were not exertional or positional. Will plan for CV screening with Ca score. -Check Ca score  #HTN: Well controlled and at goal. -Continue valsartan-HCTZ 160-'25mg'$  daily       {Are you ordering a CV Procedure (e.g. stress test, cath, DCCV, TEE, etc)?   Press F2        :[id number]}    Medication Adjustments/Labs and Tests Ordered: Current medicines are reviewed at length with the patient today.  Concerns regarding medicines are outlined above.  No orders of the defined types were placed in this encounter.  No orders of the defined types were placed in this  encounter.   There are no Patient Instructions on file for this visit.   Signed, Chelsea Bergeron, MD  06/23/2022 1:23 PM    [name]

## 2022-06-24 ENCOUNTER — Ambulatory Visit: Payer: Medicare HMO | Attending: Cardiology | Admitting: Cardiology

## 2022-07-15 DIAGNOSIS — M20012 Mallet finger of left finger(s): Secondary | ICD-10-CM | POA: Diagnosis not present

## 2022-07-15 DIAGNOSIS — M79645 Pain in left finger(s): Secondary | ICD-10-CM | POA: Diagnosis not present

## 2022-07-22 ENCOUNTER — Other Ambulatory Visit: Payer: Self-pay | Admitting: *Deleted

## 2022-07-22 ENCOUNTER — Encounter: Payer: Self-pay | Admitting: Interventional Cardiology

## 2022-07-22 ENCOUNTER — Ambulatory Visit: Payer: Medicare HMO | Attending: Cardiology | Admitting: Interventional Cardiology

## 2022-07-22 VITALS — BP 150/72 | HR 66 | Ht 60.0 in | Wt 139.6 lb

## 2022-07-22 DIAGNOSIS — D649 Anemia, unspecified: Secondary | ICD-10-CM

## 2022-07-22 DIAGNOSIS — I10 Essential (primary) hypertension: Secondary | ICD-10-CM

## 2022-07-22 DIAGNOSIS — R0782 Intercostal pain: Secondary | ICD-10-CM | POA: Diagnosis not present

## 2022-07-22 NOTE — Progress Notes (Signed)
Cardiology Office Note   Date:  07/22/2022   ID:  Chelsea Beasley, Chelsea Beasley Jan 06, 1951, MRN [id number]  PCP:  Chelsea Beasley, Chelsea Halsted, MD    No chief complaint on file.  Chest discomfort  Wt Readings from Last 3 Encounters:  07/22/22 139 lb 9.6 oz (63.3 kg)  06/03/22 137 lb 8 oz (62.4 kg)  01/06/22 134 lb 12.8 oz (61.1 kg)       History of Present Illness: Chelsea Beasley is a 71 y.o. female who is being seen today for the evaluation of chest pain at the request of Beasley, Stephen, MD.   In November, she went to the ER for chest pain.  Had a persistent cough since April 2023.  In November in 11/23, there was chest pain associated with the cough.   ER records showed: "Patient with a history of anemia, previous breast cancer, heart murmur presenting with 2 weeks of constant chest pain worse with lying down. She describes pain and pressure in the center of her chest that is constant at all times. Does not radiate. Associate with a cough that she has had since April. She has been seen and treated for acid reflux but she does not believe it is getting any better. Comes in today because her pain has been constant more severe and she is still coughing. Not able to cough up anything. No fever. No abdominal pain, nausea or vomiting. No leg pain or leg swelling. "  Negative w/u.  Negative troponins.   No further chest pain since then.  Still has the cough.  Improved with some medication.    She still works 5 days a week on her feet.  Pickleball, bicycle, walking several days a week without any sx.   Denies : Chest pain. Dizziness. Leg edema. Nitroglycerin use. Orthopnea. Palpitations. Paroxysmal nocturnal dyspnea. Shortness of breath. Syncope.    Past Medical History:  Diagnosis Date   Anemia    Cancer ([place])    breast   Carpal tunnel syndrome on right    Heart murmur     Past Surgical History:  Procedure Laterality Date   APPENDECTOMY     BREAST BIOPSY Left 05/20/2005    BREAST SURGERY     left, total, 2006, cancer, no reconstruction   CARPAL TUNNEL RELEASE Right    MASTECTOMY Left 06/04/2005   NOSE SURGERY       Current Outpatient Medications  Medication Sig Dispense Refill   benzonatate (TESSALON) 100 MG capsule Take 100 mg by mouth 3 (three) times daily as needed.     Multiple Vitamins-Calcium (ONE-A-DAY WOMENS PO) Take by mouth daily.     Vitamin D, Ergocalciferol, (DRISDOL) 1.25 MG (50000 UNIT) CAPS capsule Take 1 capsule (50,000 Units total) by mouth every 7 (seven) days for 12 doses. 12 capsule 0   omeprazole (PRILOSEC) 20 MG capsule Take 1 capsule (20 mg total) by mouth daily. 30 capsule 0   predniSONE (STERAPRED UNI-PAK 21 TAB) 10 MG (21) TBPK tablet Take as directed 21 tablet 3   valsartan-hydrochlorothiazide (DIOVAN-HCT) 160-25 MG tablet Take 1 tablet by mouth daily. 90 tablet 3   No current facility-administered medications for this visit.    Allergies:   Patient has no known allergies.    Social History:  The patient  reports that she has never smoked. She has never used smokeless tobacco. She reports current alcohol use. She reports that she does not use drugs.   Family History:  The patient's family  history includes Cancer in her mother; Hypertension in her mother and sister. No early CAD   ROS:  Please see the history of present illness.   Otherwise, review of systems are positive for mild cough.   All other systems are reviewed and negative.    PHYSICAL EXAM: VS:  BP (!) 158/80   Pulse 66   Ht 5' (1.524 m)   Wt 139 lb 9.6 oz (63.3 kg)   LMP 01/27/2005 (Approximate)   SpO2 96%   BMI 27.26 kg/m  , BMI Body mass index is 27.26 kg/m. GEN: Well nourished, well developed, in no acute distress HEENT: normal Neck: no JVD, carotid bruits, or masses Cardiac: RRR; no murmurs, rubs, or gallops,no edema  Respiratory:  clear to auscultation bilaterally, normal work of breathing GI: soft, nontender, nondistended, + BS MS: no deformity  or atrophy Skin: warm and dry, no rash Neuro:  Strength and sensation are intact Psych: euthymic mood, full affect   EKG:   The ekg ordered today demonstrates NSR, no ST changes   Recent Labs: 06/03/2022: ALT 14; BUN 16; Creatinine, Ser 0.68; Hemoglobin 10.9; Platelets 231.0; Potassium 4.0; Sodium 138   Lipid Panel    Component Value Date/Time   CHOL 181 06/03/2022 1523   TRIG 41.0 06/03/2022 1523   TRIG 55 05/04/2006 1029   HDL 93.10 06/03/2022 1523   CHOLHDL 2 06/03/2022 1523   VLDL 8.2 06/03/2022 1523   LDLCALC 80 06/03/2022 1523   LDLCALC 84 04/30/2020 1104   LDLDIRECT 112.0 11/03/2012 1506     Other studies Reviewed: Additional studies/ records that were reviewed today with results demonstrating: chest CT showed no coronary calcifications.   ASSESSMENT AND PLAN:  Atypical chest pain:  Sx have resolved.  Back to exercise without sx.   No evidence of coronary calcification on other imaging tests.  If there are any symptoms of chest discomfort with exertion, that would prompt a CTA coronaries scan or stress test.  Given the high level of activity without any symptoms, we can hold off on any ischemic testing for now.  If there is any change in exercise tolerance, let us know and we could perform some ischemic cardiac testing. HTN: Not taking valsartan/HCTZ as she did not tolerate.  She felt like she would pass out.  Home readings are checked regularly.  Had readings in the 69G systolic at home when feeling dizzy, while on valsartan /HCTZ 160/25 mg.  Has also had readings in the 295M systolic at home off of medicine.  If blood pressures at home continue to be elevated, could try valsartan/HCTZ half tablet daily ('80mg'$ /12.5 mg ) to control blood pressure and hopefully prevent low blood pressure episodes as well, that occurred on the full tablet. Anemia: Hbg 10.9. Not taking iron.  Managed by PMD.    Current medicines are reviewed at length with the patient today.  The patient  concerns regarding her medicines were addressed.  The following changes have been made:  No change  Labs/ tests ordered today include:  No orders of the defined types were placed in this encounter.   Recommend 150 minutes/week of aerobic exercise Low fat, low carb, high fiber diet recommended  Disposition:   FU as needed   Signed, Larae Grooms, MD  07/22/2022 10:39 AM    [facility] [place], [place], [place]  [address] Phone: [phone]; Fax: [phone]

## 2022-07-22 NOTE — Patient Instructions (Signed)
Medication Instructions:  Your physician recommends that you continue on your current medications as directed. Please refer to the Current Medication list given to you today.  *If you need a refill on your cardiac medications before your next appointment, please call your pharmacy*   Lab Work: none If you have labs (blood work) drawn today and your tests are completely normal, you will receive your results only by: [place] (if you have MyChart) OR A paper copy in the mail If you have any lab test that is abnormal or we need to change your treatment, we will call you to review the results.   Testing/Procedures: none   Follow-Up: At [facility], you and your health needs are our priority.  As part of our continuing mission to provide you with exceptional heart care, we have created designated Provider Care Teams.  These Care Teams include your primary Cardiologist (physician) and Advanced Practice Providers (APPs -  Physician Assistants and Nurse Practitioners) who all work together to provide you with the care you need, when you need it.  We recommend signing up for the patient portal called "MyChart".  Sign up information is provided on this After Visit Summary.  MyChart is used to connect with patients for Virtual Visits (Telemedicine).  Patients are able to view lab/test results, encounter notes, upcoming appointments, etc.  Non-urgent messages can be sent to your provider as well.   To learn more about what you can do with MyChart, go to [email or web address].    Your next appointment:   As needed  The format for your next appointment:   In Person  Provider:   Larae Grooms, MD     Other Instructions Contact office if you develop chest pain or shortness of breath.  Follow up with primary care provider regarding blood pressure  Important Information About Sugar

## 2022-07-23 ENCOUNTER — Other Ambulatory Visit: Payer: Self-pay | Admitting: Internal Medicine

## 2022-07-23 DIAGNOSIS — E559 Vitamin D deficiency, unspecified: Secondary | ICD-10-CM

## 2022-07-28 NOTE — Addendum Note (Signed)
Addended by: Sharee Holster R on: 07/28/2022 04:51 PM   Modules accepted: Orders

## 2022-08-14 ENCOUNTER — Telehealth: Payer: Self-pay | Admitting: Internal Medicine

## 2022-08-14 NOTE — Telephone Encounter (Signed)
Pt called to request a refill of the following:  Amlodipine  '5mg'$ .  Pt states she does not like the Valsartan-hydrochlorothiazide  LOV:  06/03/22  Please advise.  [id number] [address]: [phone] Fax: [phone]

## 2022-08-17 NOTE — Telephone Encounter (Signed)
Not on current medication list.  Okay to refill? 

## 2022-08-19 NOTE — Telephone Encounter (Signed)
Called Pt to schedule, no answer... LVM.

## 2022-08-20 NOTE — Telephone Encounter (Signed)
Appointment scheudled.

## 2022-08-24 ENCOUNTER — Encounter: Payer: Self-pay | Admitting: Internal Medicine

## 2022-08-24 ENCOUNTER — Ambulatory Visit (INDEPENDENT_AMBULATORY_CARE_PROVIDER_SITE_OTHER): Payer: Medicare HMO | Admitting: Internal Medicine

## 2022-08-24 VITALS — BP 146/80 | HR 67 | Temp 98.7°F | Wt 135.0 lb

## 2022-08-24 DIAGNOSIS — I10 Essential (primary) hypertension: Secondary | ICD-10-CM | POA: Diagnosis not present

## 2022-08-24 DIAGNOSIS — T22112A Burn of first degree of left forearm, initial encounter: Secondary | ICD-10-CM | POA: Diagnosis not present

## 2022-08-24 MED ORDER — AMLODIPINE BESYLATE 5 MG PO TABS: 5.0000 mg | ORAL_TABLET | Freq: Every day | ORAL | 1 refills | Status: DC

## 2022-08-24 MED ORDER — SILVER SULFADIAZINE 1 % EX CREA: 1.0000 | TOPICAL_CREAM | Freq: Every day | CUTANEOUS | 0 refills | Status: DC

## 2022-08-24 NOTE — Progress Notes (Signed)
Established Patient Office Visit     CC/Reason for Visit: Follow-up chronic conditions, discuss acute concerns  HPI: Chelsea Beasley is a 72 y.o. female who is coming in today for the above mentioned reasons. Past Medical History is significant for: Hypertension.  She was prescribed Diovan HCTZ, however was unable to tolerate due to dizziness.  She started taking some leftover amlodipine.  She is feeling well.  Most recent blood pressure measurements are 157/88, 148/82, 146/85, 153/82, 120/73, 138/77.  She works in a [employer], today she suffered a burn of her left forearm.   Past Medical/Surgical History: Past Medical History:  Diagnosis Date   Anemia    Cancer ([place])    breast   Carpal tunnel syndrome on right    Heart murmur     Past Surgical History:  Procedure Laterality Date   APPENDECTOMY     BREAST BIOPSY Left 05/20/2005   BREAST SURGERY     left, total, 2006, cancer, no reconstruction   CARPAL TUNNEL RELEASE Right    MASTECTOMY Left 06/04/2005   NOSE SURGERY      Social History:  reports that she has never smoked. She has never used smokeless tobacco. She reports current alcohol use. She reports that she does not use drugs.  Allergies: No Known Allergies  Family History:  Family History  Problem Relation Age of Onset   Hypertension Mother    Cancer Mother        lung   Hypertension Sister    Colon cancer Neg Hx    Esophageal cancer Neg Hx    Stomach cancer Neg Hx    Rectal cancer Neg Hx      Current Outpatient Medications:    Multiple Vitamins-Calcium (ONE-A-DAY WOMENS PO), Take by mouth daily., Disp: , Rfl:    silver sulfADIAZINE (SILVADENE) 1 % cream, Apply 1 Application topically daily., Disp: 50 g, Rfl: 0   amLODipine (NORVASC) 5 MG tablet, Take 1 tablet (5 mg total) by mouth daily., Disp: 90 tablet, Rfl: 1  Review of Systems:  Negative unless indicated in HPI.   Physical Exam: Vitals:   08/24/22 1423 08/24/22 1426  BP: (!) 142/85 (!)  146/80  Pulse: 67   Temp: 98.7 F (37.1 C)   TempSrc: Oral   SpO2: 98%   Weight: 135 lb (61.2 kg)     Body mass index is 26.37 kg/m.   Physical Exam Vitals reviewed.  Constitutional:      Appearance: Normal appearance.  HENT:     Head: Normocephalic and atraumatic.  Eyes:     Conjunctiva/sclera: Conjunctivae normal.     Pupils: Pupils are equal, round, and reactive to light.  Cardiovascular:     Rate and Rhythm: Normal rate and regular rhythm.  Pulmonary:     Effort: Pulmonary effort is normal.     Breath sounds: Normal breath sounds.  Skin:    General: Skin is warm and dry.          Comments: First-degree burn of inner aspect of left forearm with blister formation  Neurological:     General: No focal deficit present.     Mental Status: She is alert and oriented to person, place, and time.  Psychiatric:        Mood and Affect: Mood normal.        Behavior: Behavior normal.        Thought Content: Thought content normal.        Judgment: Judgment normal.  Impression and Plan:  Primary hypertension - Plan: amLODipine (NORVASC) 5 MG tablet  Superficial burn of left forearm, initial encounter - Plan: silver sulfADIAZINE (SILVADENE) 1 % cream  -Blood pressure is elevated, however she has only been on amlodipine now for 2 weeks.  She has discontinued Diovan HCTZ.  She will return in 3 months for follow-up. -Silvadene cream for her first-degree burn.  Time spent:31 minutes reviewing chart, interviewing and examining patient and formulating plan of care.     Lelon Frohlich, MD [facility] at [facility]

## 2022-08-26 ENCOUNTER — Encounter: Payer: Self-pay | Admitting: Internal Medicine

## 2022-11-15 ENCOUNTER — Other Ambulatory Visit: Payer: Self-pay | Admitting: Internal Medicine

## 2022-11-15 DIAGNOSIS — T22112A Burn of first degree of left forearm, initial encounter: Secondary | ICD-10-CM

## 2022-11-23 ENCOUNTER — Encounter: Payer: Self-pay | Admitting: Internal Medicine

## 2022-11-23 ENCOUNTER — Ambulatory Visit (INDEPENDENT_AMBULATORY_CARE_PROVIDER_SITE_OTHER): Payer: Medicare HMO | Admitting: Internal Medicine

## 2022-11-23 VITALS — BP 129/70 | HR 75 | Temp 98.7°F | Wt 133.6 lb

## 2022-11-23 DIAGNOSIS — J302 Other seasonal allergic rhinitis: Secondary | ICD-10-CM | POA: Diagnosis not present

## 2022-11-23 DIAGNOSIS — I10 Essential (primary) hypertension: Secondary | ICD-10-CM

## 2022-11-23 MED ORDER — CETIRIZINE HCL 10 MG PO TABS: 10.0000 mg | ORAL_TABLET | Freq: Every day | ORAL | 11 refills | Status: DC

## 2022-11-23 NOTE — Assessment & Plan Note (Signed)
Start cetirizine daily.

## 2022-11-23 NOTE — Progress Notes (Signed)
Established Patient Office Visit     CC/Reason for Visit: Follow-up blood pressure, discuss allergies  HPI: Chelsea Beasley is a 72 y.o. female who is coming in today for the above mentioned reasons. Past Medical History is significant for: Hypertension.  Last visit she was started on amlodipine 5 mg daily due to uncontrolled blood pressure.  It is significantly improved.  Some of her most recent measurements are: 124/66, 130/74, 116/64, 112/63, 120/70, 118/74, 124/73, 132/74.  She has also been having a lot of postnasal drip and cough.   Past Medical/Surgical History: Past Medical History:  Diagnosis Date   Anemia    Cancer ([facility])    breast   Carpal tunnel syndrome on right    Heart murmur     Past Surgical History:  Procedure Laterality Date   APPENDECTOMY     BREAST BIOPSY Left 05/20/2005   BREAST SURGERY     left, total, 2006, cancer, no reconstruction   CARPAL TUNNEL RELEASE Right    MASTECTOMY Left 06/04/2005   NOSE SURGERY      Social History:  reports that she has never smoked. She has never used smokeless tobacco. She reports current alcohol use. She reports that she does not use drugs.  Allergies: No Known Allergies  Family History:  Family History  Problem Relation Age of Onset   Hypertension Mother    Cancer Mother        lung   Hypertension Sister    Colon cancer Neg Hx    Esophageal cancer Neg Hx    Stomach cancer Neg Hx    Rectal cancer Neg Hx      Current Outpatient Medications:    amLODipine (NORVASC) 5 MG tablet, Take 1 tablet (5 mg total) by mouth daily., Disp: 90 tablet, Rfl: 1   benzonatate (TESSALON) 200 MG capsule, Take 200 mg by mouth 3 (three) times daily as needed for cough., Disp: , Rfl:    cetirizine (ZYRTEC) 10 MG tablet, Take 1 tablet (10 mg total) by mouth daily., Disp: 30 tablet, Rfl: 11   Multiple Vitamins-Calcium (ONE-A-DAY WOMENS PO), Take by mouth daily., Disp: , Rfl:    silver sulfADIAZINE (SILVADENE) 1 % cream, APPLY  1 APPLICATION TOPICALLY DAILY, Disp: 50 g, Rfl: 0  Review of Systems:  Negative unless indicated in HPI.   Physical Exam: Vitals:   11/23/22 1414  BP: 129/70  Pulse: 75  Temp: 98.7 F (37.1 C)  TempSrc: Oral  SpO2: 96%  Weight: 133 lb 9.6 oz (60.6 kg)    Body mass index is 26.09 kg/m.   Physical Exam Vitals reviewed.  Constitutional:      Appearance: Normal appearance.  HENT:     Head: Normocephalic and atraumatic.  Eyes:     Conjunctiva/sclera: Conjunctivae normal.     Pupils: Pupils are equal, round, and reactive to light.  Cardiovascular:     Rate and Rhythm: Normal rate and regular rhythm.  Pulmonary:     Effort: Pulmonary effort is normal.     Breath sounds: Normal breath sounds.  Skin:    General: Skin is warm and dry.  Neurological:     General: No focal deficit present.     Mental Status: She is alert and oriented to person, place, and time.  Psychiatric:        Mood and Affect: Mood normal.        Behavior: Behavior normal.        Thought Content: Thought content  normal.        Judgment: Judgment normal.      Impression and Plan:  Primary hypertension Assessment & Plan: Well controlled on amlodipine 5 mg daily.   Seasonal allergies Assessment & Plan: Start cetirizine daily.  Orders: -     Cetirizine HCl; Take 1 tablet (10 mg total) by mouth daily.  Dispense: 30 tablet; Refill: 11     Time spent:31 minutes reviewing chart, interviewing and examining patient and formulating plan of care.     Chaya Jan, MD [facility] at [facility]

## 2022-11-23 NOTE — Assessment & Plan Note (Signed)
Well-controlled on amlodipine 5 mg daily. 

## 2022-11-26 ENCOUNTER — Ambulatory Visit
Admission: RE | Admit: 2022-11-26 | Discharge: 2022-11-26 | Disposition: A | Discharge status: Home / Self Care | Payer: Medicare HMO | Source: Ambulatory Visit | Attending: Internal Medicine | Admitting: Internal Medicine

## 2022-11-26 DIAGNOSIS — E559 Vitamin D deficiency, unspecified: Secondary | ICD-10-CM | POA: Diagnosis not present

## 2022-11-26 DIAGNOSIS — Z13820 Encounter for screening for osteoporosis: Secondary | ICD-10-CM

## 2022-11-26 DIAGNOSIS — M810 Age-related osteoporosis without current pathological fracture: Secondary | ICD-10-CM | POA: Diagnosis not present

## 2022-11-26 DIAGNOSIS — E349 Endocrine disorder, unspecified: Secondary | ICD-10-CM | POA: Diagnosis not present

## 2022-12-28 ENCOUNTER — Encounter: Payer: Self-pay | Admitting: Gastroenterology

## 2023-01-04 ENCOUNTER — Encounter: Payer: Self-pay | Admitting: Gastroenterology

## 2023-02-05 ENCOUNTER — Encounter: Payer: Self-pay | Admitting: Internal Medicine

## 2023-02-05 DIAGNOSIS — Z4432 Encounter for fitting and adjustment of external left breast prosthesis: Secondary | ICD-10-CM | POA: Diagnosis not present

## 2023-02-05 DIAGNOSIS — C50112 Malignant neoplasm of central portion of left female breast: Secondary | ICD-10-CM | POA: Diagnosis not present

## 2023-02-05 DIAGNOSIS — Z9012 Acquired absence of left breast and nipple: Secondary | ICD-10-CM | POA: Diagnosis not present

## 2023-02-09 ENCOUNTER — Telehealth: Payer: Self-pay | Admitting: Internal Medicine

## 2023-02-09 NOTE — Telephone Encounter (Signed)
Last 2 office notes were efaxed.

## 2023-02-09 NOTE — Telephone Encounter (Signed)
[facility] called to say they still need documentation  They are requesting most recent supporting clinical notes, regarding mastectomy  Call back [phone]  Fax [phone]

## 2023-02-14 ENCOUNTER — Other Ambulatory Visit: Payer: Self-pay | Admitting: Internal Medicine

## 2023-02-14 DIAGNOSIS — I10 Essential (primary) hypertension: Secondary | ICD-10-CM

## 2023-02-22 ENCOUNTER — Ambulatory Visit (AMBULATORY_SURGERY_CENTER): Payer: Medicare HMO

## 2023-02-22 ENCOUNTER — Other Ambulatory Visit: Payer: Self-pay | Admitting: Internal Medicine

## 2023-02-22 VITALS — Ht 60.0 in | Wt 129.0 lb

## 2023-02-22 DIAGNOSIS — Z1231 Encounter for screening mammogram for malignant neoplasm of breast: Secondary | ICD-10-CM

## 2023-02-22 DIAGNOSIS — Z86010 Personal history of colonic polyps: Secondary | ICD-10-CM

## 2023-02-22 MED ORDER — NA SULFATE-K SULFATE-MG SULF 17.5-3.13-1.6 GM/177ML PO SOLN
1.0000 | Freq: Once | ORAL | 0 refills | Status: AC
Start: 2023-02-22 — End: 2023-02-22

## 2023-02-22 NOTE — Progress Notes (Signed)

## 2023-02-23 ENCOUNTER — Encounter: Payer: Self-pay | Admitting: Gastroenterology

## 2023-02-24 ENCOUNTER — Ambulatory Visit
Admission: RE | Admit: 2023-02-24 | Discharge: 2023-02-24 | Disposition: A | Discharge status: Home / Self Care | Payer: Medicare HMO | Source: Ambulatory Visit | Attending: Internal Medicine | Admitting: Internal Medicine

## 2023-02-24 DIAGNOSIS — Z1231 Encounter for screening mammogram for malignant neoplasm of breast: Secondary | ICD-10-CM | POA: Diagnosis not present

## 2023-03-09 ENCOUNTER — Encounter: Payer: Self-pay | Admitting: Gastroenterology

## 2023-03-09 ENCOUNTER — Ambulatory Visit (AMBULATORY_SURGERY_CENTER): Payer: Medicare HMO | Admitting: Gastroenterology

## 2023-03-09 VITALS — BP 132/71 | HR 61 | Temp 97.1°F | Resp 13 | Ht 60.0 in | Wt 129.0 lb

## 2023-03-09 DIAGNOSIS — D123 Benign neoplasm of transverse colon: Secondary | ICD-10-CM

## 2023-03-09 DIAGNOSIS — Z09 Encounter for follow-up examination after completed treatment for conditions other than malignant neoplasm: Secondary | ICD-10-CM | POA: Diagnosis not present

## 2023-03-09 DIAGNOSIS — K635 Polyp of colon: Secondary | ICD-10-CM | POA: Diagnosis not present

## 2023-03-09 DIAGNOSIS — D125 Benign neoplasm of sigmoid colon: Secondary | ICD-10-CM

## 2023-03-09 DIAGNOSIS — Z86010 Personal history of colonic polyps: Secondary | ICD-10-CM

## 2023-03-09 DIAGNOSIS — I10 Essential (primary) hypertension: Secondary | ICD-10-CM | POA: Diagnosis not present

## 2023-03-09 MED ORDER — SODIUM CHLORIDE 0.9 % IV SOLN
500.0000 mL | Freq: Once | INTRAVENOUS | Status: AC
Start: 2023-03-09 — End: ?

## 2023-03-09 NOTE — Patient Instructions (Signed)
Impression/Recommendations:  Polyp and hemorrhoid handouts given to patient.  Resume previous diet. Continue present medications. Await pathology results.  Repeat colonoscopy for surveillance to be recommended after pathology results are reviewed.  YOU HAD AN ENDOSCOPIC PROCEDURE TODAY AT THE [facility]:   Refer to the procedure report that was given to you for any specific questions about what was found during the examination.  If the procedure report does not answer your questions, please call your gastroenterologist to clarify.  If you requested that your care partner not be given the details of your procedure findings, then the procedure report has been included in a sealed envelope for you to review at your convenience later.  YOU SHOULD EXPECT: Some feelings of bloating in the abdomen. Passage of more gas than usual.  Walking can help get rid of the air that was put into your GI tract during the procedure and reduce the bloating. If you had a lower endoscopy (such as a colonoscopy or flexible sigmoidoscopy) you may notice spotting of blood in your stool or on the toilet paper. If you underwent a bowel prep for your procedure, you may not have a normal bowel movement for a few days.  Please Note:  You might notice some irritation and congestion in your nose or some drainage.  This is from the oxygen used during your procedure.  There is no need for concern and it should clear up in a day or so.  SYMPTOMS TO REPORT IMMEDIATELY:  Following lower endoscopy (colonoscopy or flexible sigmoidoscopy):  Excessive amounts of blood in the stool  Significant tenderness or worsening of abdominal pains  Swelling of the abdomen that is new, acute  Fever of 100F or higher For urgent or emergent issues, a gastroenterologist can be reached at any hour by calling [phone]. Do not use MyChart messaging for urgent concerns.    DIET:  We do recommend a small meal at first, but then you  may proceed to your regular diet.  Drink plenty of fluids but you should avoid alcoholic beverages for 24 hours.  ACTIVITY:  You should plan to take it easy for the rest of today and you should NOT DRIVE or use heavy machinery until tomorrow (because of the sedation medicines used during the test).    FOLLOW UP: Our staff will call the number listed on your records the next business day following your procedure.  We will call around 7:15- 8:00 am to check on you and address any questions or concerns that you may have regarding the information given to you following your procedure. If we do not reach you, we will leave a message.     If any biopsies were taken you will be contacted by phone or by letter within the next 1-3 weeks.  Please call us at [phone] if you have not heard about the biopsies in 3 weeks.    SIGNATURES/CONFIDENTIALITY: You and/or your care partner have signed paperwork which will be entered into your electronic medical record.  These signatures attest to the fact that that the information above on your After Visit Summary has been reviewed and is understood.  Full responsibility of the confidentiality of this discharge information lies with you and/or your care-partner.

## 2023-03-09 NOTE — Progress Notes (Signed)
Report to PACU, RN, vss, BBS= Clear.  

## 2023-03-09 NOTE — Op Note (Signed)
[facility] Patient Name: Chelsea Beasley Procedure Date: 03/09/2023 1:03 PM MRN: [id number] Endoscopist: Meryl Dare , MD, [phone] Age: 72 Referring MD:  Date of Birth: Jun 05, 1951 Gender: Female Account #: [id number] Procedure:                Colonoscopy Indications:              Surveillance: Personal history of adenomatous                            polyps on last colonoscopy 5 years ago Medicines:                Monitored Anesthesia Care Procedure:                Pre-Anesthesia Assessment:                           - Prior to the procedure, a History and Physical                            was performed, and patient medications and                            allergies were reviewed. The patient's tolerance of                            previous anesthesia was also reviewed. The risks                            and benefits of the procedure and the sedation                            options and risks were discussed with the patient.                            All questions were answered, and informed consent                            was obtained. Prior Anticoagulants: The patient has                            taken no anticoagulant or antiplatelet agents. ASA                            Grade Assessment: II - A patient with mild systemic                            disease. After reviewing the risks and benefits,                            the patient was deemed in satisfactory condition to                            undergo the procedure.  After obtaining informed consent, the colonoscope                            was passed under direct vision. Throughout the                            procedure, the patient's blood pressure, pulse, and                            oxygen saturations were monitored continuously. The                            CF [id number] #[id number] was introduced through the anus                            and advanced to the  the cecum, identified by                            appendiceal orifice and ileocecal valve. The                            ileocecal valve, appendiceal orifice, and rectum                            were photographed. The quality of the bowel                            preparation was good. The colonoscopy was performed                            without difficulty. The patient tolerated the                            procedure well. Scope In: 1:29:37 PM Scope Out: 1:47:55 PM Scope Withdrawal Time: 0 hours 14 minutes 31 seconds  Total Procedure Duration: 0 hours 18 minutes 18 seconds  Findings:                 The perianal and digital rectal examinations were                            normal.                           Three sessile polyps were found in the sigmoid                            colon (2) and transverse colon (1). The polyps were                            6 to 7 mm in size. These polyps were removed with a                            cold snare. Resection and retrieval were complete.  External and internal hemorrhoids were found during                            retroflexion. The hemorrhoids were small and Grade                            I (internal hemorrhoids that do not prolapse).                           The exam was otherwise without abnormality on                            direct and retroflexion views. Complications:            No immediate complications. Estimated blood loss:                            None. Estimated Blood Loss:     Estimated blood loss: none. Impression:               - Three 6 to 7 mm polyps in the sigmoid colon and                            in the transverse colon, removed with a cold snare.                            Resected and retrieved.                           - External and internal hemorrhoids.                           - The examination was otherwise normal on direct                            and  retroflexion views. Recommendation:           - Repeat colonoscopy after studies are complete for                            surveillance based on pathology results.                           - Patient has a contact number available for                            emergencies. The signs and symptoms of potential                            delayed complications were discussed with the                            patient. Return to normal activities tomorrow.                            Written discharge instructions  were provided to the                            patient.                           - Resume previous diet.                           - Continue present medications.                           - Await pathology results. Meryl Dare, MD 03/09/2023 1:50:20 PM This report has been signed electronically.

## 2023-03-09 NOTE — Progress Notes (Signed)
History & Physical  Primary Care Physician:  Philip Aspen, Limmie Patricia, MD Primary Gastroenterologist: Claudette Head, MD  Impression / Plan:  Personal history of adenomatous colon polyps for colonoscopy.  CHIEF COMPLAINT:  Personal history of colon polyps   HPI: Chelsea Beasley is a 72 y.o. female personal history of adenomatous colon polyps for colonoscopy.    Past Medical History:  Diagnosis Date   Anemia    Cancer (HCC)    breast   Carpal tunnel syndrome on right    Heart murmur     Past Surgical History:  Procedure Laterality Date   APPENDECTOMY     BREAST BIOPSY Left 05/20/2005   BREAST SURGERY     left, total, 2006, cancer, no reconstruction   CARPAL TUNNEL RELEASE Right    MASTECTOMY Left 06/04/2005   NOSE SURGERY      Prior to Admission medications   Medication Sig Start Date End Date Taking? Authorizing Provider  amLODipine (NORVASC) 5 MG tablet TAKE 1 TABLET (5 MG TOTAL) BY MOUTH DAILY. 02/15/23  Yes Philip Aspen, Limmie Patricia, MD  cetirizine (ZYRTEC) 10 MG tablet Take 1 tablet (10 mg total) by mouth daily. Patient taking differently: Take 10 mg by mouth daily as needed. 11/23/22   Philip Aspen, Limmie Patricia, MD  Multiple Vitamins-Calcium (ONE-A-DAY WOMENS PO) Take by mouth daily.    [provider]    Current Outpatient Medications  Medication Sig Dispense Refill   amLODipine (NORVASC) 5 MG tablet TAKE 1 TABLET (5 MG TOTAL) BY MOUTH DAILY. 90 tablet 1   cetirizine (ZYRTEC) 10 MG tablet Take 1 tablet (10 mg total) by mouth daily. (Patient taking differently: Take 10 mg by mouth daily as needed.) 30 tablet 11   Multiple Vitamins-Calcium (ONE-A-DAY WOMENS PO) Take by mouth daily.     Current Facility-Administered Medications  Medication Dose Route Frequency Provider Last Rate Last Admin   0.9 %  sodium chloride infusion  500 mL Intravenous Once Meryl Dare, MD        Allergies as of 03/09/2023   (No Known Allergies)    Family History   Problem Relation Age of Onset   Hypertension Mother    Cancer Mother        lung   Hypertension Sister    Colon cancer Neg Hx    Esophageal cancer Neg Hx    Stomach cancer Neg Hx    Rectal cancer Neg Hx     Social History   Socioeconomic History   Marital status: Divorced    Spouse name: Not on file   Number of children: 2   Years of education: Not on file   Highest education level: Not on file  Occupational History    Employer: [employer]    Comment: Diplomatic Services operational officer  Tobacco Use   Smoking status: Never   Smokeless tobacco: Never  Vaping Use   Vaping status: Never Used  Substance and Sexual Activity   Alcohol use: Yes    Comment: Occasional beer   Drug use: No   Sexual activity: Not on file  Other Topics Concern   Not on file  Social History Narrative   Not on file   Social Determinants of Health   Financial Resource Strain: Not on file  Food Insecurity: No Food Insecurity (06/10/2022)   Hunger Vital Sign    Worried About Running Out of Food in the Last Year: Never true    Ran Out of Food in the Last Year:  Never true  Transportation Needs: No Transportation Needs (06/10/2022)   PRAPARE - Administrator, Civil Service (Medical): No    Lack of Transportation (Non-Medical): No  Physical Activity: Not on file  Stress: Not on file  Social Connections: Not on file  Intimate Partner Violence: Not on file    Review of Systems:  All systems reviewed were negative except where noted in HPI.   Physical Exam:  General:  Alert, well-developed, in NAD Head:  Normocephalic and atraumatic. Eyes:  Sclera clear, no icterus.   Conjunctiva pink. Ears:  Normal auditory acuity. Mouth:  No deformity or lesions.  Neck:  Supple; no masses. Lungs:  Clear throughout to auscultation.   No wheezes, crackles, or rhonchi.  Heart:  Regular rate and rhythm; no murmurs. Abdomen:  Soft, nondistended, nontender. No masses, hepatomegaly. No palpable masses.  Normal  bowel sounds.    Rectal:  Deferred   Msk:  Symmetrical without gross deformities. Extremities:  Without edema. Neurologic:  Alert and  oriented x 4; grossly normal neurologically. Skin:  Intact without significant lesions or rashes. Psych:  Alert and cooperative. Normal mood and affect.   Venita Lick. Russella Dar  03/09/2023, 1:17 PM See Loretha Stapler, [facility], to contact our on call provider

## 2023-03-09 NOTE — Progress Notes (Signed)
Called to room to assist during endoscopic procedure.  Patient ID and intended procedure confirmed with present staff. Received instructions for my participation in the procedure from the performing physician.  

## 2023-03-10 ENCOUNTER — Telehealth: Payer: Self-pay | Admitting: *Deleted

## 2023-03-10 NOTE — Telephone Encounter (Signed)
  Follow up Call-     03/09/2023   12:45 PM  Call back number  Post procedure Call Back phone  # [phone]  Permission to leave phone message Yes     Patient questions:  Do you have a fever, pain , or abdominal swelling? No. Pain Score  0 *  Have you tolerated food without any problems? Yes.    Have you been able to return to your normal activities? Yes.    Do you have any questions about your discharge instructions: Diet   No. Medications  No. Follow up visit  No.  Do you have questions or concerns about your Care? No.  Actions: * If pain score is 4 or above: No action needed, pain <4.

## 2023-03-23 ENCOUNTER — Encounter: Payer: Self-pay | Admitting: Gastroenterology

## 2023-06-11 ENCOUNTER — Other Ambulatory Visit: Payer: Self-pay | Admitting: Internal Medicine

## 2023-06-11 DIAGNOSIS — I10 Essential (primary) hypertension: Secondary | ICD-10-CM

## 2023-06-11 DIAGNOSIS — T22112A Burn of first degree of left forearm, initial encounter: Secondary | ICD-10-CM

## 2023-07-15 ENCOUNTER — Encounter (INDEPENDENT_AMBULATORY_CARE_PROVIDER_SITE_OTHER): Payer: Medicare HMO | Admitting: Family Medicine

## 2023-07-15 ENCOUNTER — Telehealth: Payer: Self-pay | Admitting: Internal Medicine

## 2023-07-15 NOTE — Telephone Encounter (Signed)
Called Pt again // Pt answered // Pt rescheduled for 07/22/23.

## 2023-07-15 NOTE — Telephone Encounter (Signed)
*  Called Pt twice on 07/15/23 at 2:35 pm & 2:37 pm - No answer // voicemail full // unable to leave a message - Will continue to try Pt until 3 pm (IC)

## 2023-07-15 NOTE — Progress Notes (Signed)
error 

## 2023-07-22 ENCOUNTER — Ambulatory Visit: Payer: Medicare HMO | Admitting: Family Medicine

## 2023-07-22 VITALS — BP 134/67 | HR 76

## 2023-07-22 DIAGNOSIS — Z0000 Encounter for general adult medical examination without abnormal findings: Secondary | ICD-10-CM | POA: Diagnosis not present

## 2023-07-22 NOTE — Progress Notes (Signed)
PATIENT CHECK-IN and HEALTH RISK ASSESSMENT QUESTIONNAIRE:  -completed by phone/video for upcoming Medicare Preventive Visit   Pre-Visit Check-in: 1)Vitals (height, wt, BP, etc) - record in vitals section for visit on day of visit Request home vitals (wt, BP, etc.) and enter into vitals, THEN update Vital Signs SmartPhrase below at the top of the HPI. See below.  2)Review and Update Medications, Allergies PMH, Surgeries, Social history in Epic 3)Hospitalizations in the last year with date/reason? NO   4)Review and Update Care Team (patient's specialists) in Epic 5) Complete PHQ9 in Epic  6) Complete Fall Screening in Epic 7)Review all Health Maintenance Due and order under PCP if not done.  Medicare Wellness Patient Questionnaire:  Answer theses question about your habits: How often do you have a drink containing alcohol?yes  How many drinks containing alcohol do you have on a typical day when you are drinking?1 How often do you have six or more drinks on one occasion?Na  Have you ever smoked?No  Quit date if applicable? NA   How many packs a day do/did you smoke? NA  Do you use smokeless tobacco?NA  Do you use an illicit drugs?NO  On average, how many days per week do you engage in moderate to strenuous exercise (like a brisk walk)? Yes, 3 times a week  On average, how many minutes do you engage in exercise at this level?30 minutes  Are you sexually active? Yes Number of partners?One  Typical breakfast: eggs bacon and grape juice  Typical lunch:fruit, chicken sandwich or salad  Typical dinner:chicken Typical snacks: chips   Beverages: water  Answer theses question about your everyday activities: Can you perform most household chores?yes  Are you deaf or have significant trouble hearing?No  Do you feel that you have a problem with memory?NO  Do you feel safe at home?Yes  Last dentist visit? A week ago  8. Do you have any difficulty performing your everyday activities?NO  Are  you having any difficulty walking, taking medications on your own, and or difficulty managing daily home needs?NO  Do you have difficulty walking or climbing stairs?NO  Do you have difficulty dressing or bathing?NO  Do you have difficulty doing errands alone such as visiting a doctor's office or shopping?NO  Do you currently have any difficulty preparing food and eating?NO  Do you currently have any difficulty using the toilet?No  Do you have any difficulty managing your finances?NO  Do you have any difficulties with housekeeping of managing your housekeeping?NO    Do you have Advanced Directives in place (Living Will, Healthcare Power or Attorney)? NO    Last eye Exam and location?April 2023    Do you currently use prescribed or non-prescribed narcotic or opioid pain medications?NO   Do you have a history or close family history of breast, ovarian, tubal or peritoneal cancer or a family member with BRCA (breast cancer susceptibility 1 and 2) gene mutations? Yes    Nurse/Assistant Credentials/time stamp:Leah ADelford Field CMA 12:53pm    ----------------------------------------------------------------------------------------------------------------------------------------------------------------------------------------------------------------------  Because this visit was a virtual/telehealth visit, some criteria may be missing or patient reported. Any vitals not documented were not able to be obtained and vitals that have been documented are patient reported.    MEDICARE ANNUAL PREVENTIVE VISIT WITH PROVIDER: (Welcome to Medicare, initial annual wellness or annual wellness exam)  Virtual Visit via Phone Note  I connected with Chelsea Beasley on 07/22/23 by phone  and verified that I am speaking with the correct person using two  identifiers.  Location patient: home Location provider:work or home office Persons participating in the virtual visit: patient, provider  Concerns and/or  follow up today: no concerns   See HM section in Epic for other details of completed HM.    ROS: negative for report of fevers, unintentional weight loss, vision changes, vision loss, hearing loss or change, chest pain, sob, hemoptysis, melena, hematochezia, hematuria, falls, bleeding or bruising, thoughts of suicide or self harm, memory loss  Patient-completed extensive health risk assessment - reviewed and discussed with the patient: See Health Risk Assessment completed with patient prior to the visit either above or in recent phone note. This was reviewed in detailed with the patient today and appropriate recommendations, orders and referrals were placed as needed per Summary below and patient instructions.   Review of Medical History: -PMH, PSH, Family History and current specialty and care providers reviewed and updated and listed below   Patient Care Team: Philip Aspen, Limmie Patricia, MD as PCP - General (Internal Medicine) Corky Crafts, MD as PCP - Cardiology (Cardiology)   Past Medical History:  Diagnosis Date   Anemia    Cancer [facility])    breast   Carpal tunnel syndrome on right    Heart murmur     Past Surgical History:  Procedure Laterality Date   APPENDECTOMY     BREAST BIOPSY Left 05/20/2005   BREAST SURGERY     left, total, 2006, cancer, no reconstruction   CARPAL TUNNEL RELEASE Right    MASTECTOMY Left 06/04/2005   NOSE SURGERY      Social History   Socioeconomic History   Marital status: Divorced    Spouse name: Not on file   Number of children: 2   Years of education: Not on file   Highest education level: GED or equivalent  Occupational History    Employer: [employer]    Comment: Diplomatic Services operational officer  Tobacco Use   Smoking status: Never   Smokeless tobacco: Never  Vaping Use   Vaping status: Never Used  Substance and Sexual Activity   Alcohol use: Yes    Comment: Occasional beer   Drug use: No   Sexual activity: Not on file  Other  Topics Concern   Not on file  Social History Narrative   Not on file   Social Drivers of Health   Financial Resource Strain: Low Risk  (07/22/2023)   Overall Financial Resource Strain (CARDIA)    Difficulty of Paying Living Expenses: Not hard at all  Food Insecurity: No Food Insecurity (07/22/2023)   Hunger Vital Sign    Worried About Running Out of Food in the Last Year: Never true    Ran Out of Food in the Last Year: Never true  Transportation Needs: No Transportation Needs (07/22/2023)   PRAPARE - Administrator, Civil Service (Medical): No    Lack of Transportation (Non-Medical): No  Physical Activity: Insufficiently Active (07/22/2023)   Exercise Vital Sign    Days of Exercise per Week: 3 days    Minutes of Exercise per Session: 30 min  Stress: No Stress Concern Present (07/22/2023)   [employer] of Occupational Health - Occupational Stress Questionnaire    Feeling of Stress : Not at all  Social Connections: Moderately Integrated (07/22/2023)   Social Connection and Isolation Panel [NHANES]    Frequency of Communication with Friends and Family: More than three times a week    Frequency of Social Gatherings with Friends and Family: Once a  week    Attends Religious Services: 1 to 4 times per year    Active Member of Clubs or Organizations: Yes    Attends Banker Meetings: 1 to 4 times per year    Marital Status: Divorced  Catering manager Violence: Not on file    Family History  Problem Relation Age of Onset   Hypertension Mother    Cancer Mother        lung   Hypertension Sister    Colon cancer Neg Hx    Esophageal cancer Neg Hx    Stomach cancer Neg Hx    Rectal cancer Neg Hx     Current Outpatient Medications on File Prior to Visit  Medication Sig Dispense Refill   amLODipine (NORVASC) 5 MG tablet TAKE 1 TABLET (5 MG TOTAL) BY MOUTH DAILY. 90 tablet 1   cetirizine (ZYRTEC) 10 MG tablet Take 1 tablet (10 mg total) by mouth daily.  (Patient taking differently: Take 10 mg by mouth daily as needed.) 30 tablet 11   Multiple Vitamins-Calcium (ONE-A-DAY WOMENS PO) Take by mouth daily.     Current Facility-Administered Medications on File Prior to Visit  Medication Dose Route Frequency Provider Last Rate Last Admin   0.9 %  sodium chloride infusion  500 mL Intravenous Once Meryl Dare, MD        No Known Allergies     Physical Exam Vitals requested from patient and listed below if patient had equipment and was able to obtain at home for this virtual visit: Vitals:   07/22/23 1244  BP: 134/67  Pulse: 76   Estimated body mass index is 25.19 kg/m as calculated from the following:   Height as of 03/09/23: 5' (1.524 m).   Weight as of 03/09/23: 129 lb (58.5 kg).  EKG (optional): deferred due to virtual visit  GENERAL: alert, oriented, no acute distress detected, full vision exam deferred due to pandemic and/or virtual encounter  PSYCH/NEURO: pleasant and cooperative, no obvious depression or anxiety, speech and thought processing grossly intact, Cognitive function grossly intact  Flowsheet Row Office Visit [facility]/20[facility]  PHQ-9 Total Score 2           07/22/2023   12:44 PM 11/23/2022    2:20 PM 06/03/2022    2:44 PM 10/08/2021    2:27 PM 04/29/2021    2:53 PM  Depression screen PHQ 2/9  Decreased Interest 0 0 0 0 0  Down, Depressed, Hopeless 0 0 0 0 0  PHQ - 2 Score 0 0 0 0 0  Altered sleeping  2 1 0 0  Tired, decreased energy  0 1 1 0  Change in appetite  0 0 0 0  Feeling bad or failure about yourself   0 0 0 0  Trouble concentrating  0 0 0 0  Moving slowly or fidgety/restless  0 0 0 0  Suicidal thoughts  0 0 0 0  PHQ-9 Score  2 2 1  0  Difficult doing work/chores  Somewhat difficult Not difficult at all Not difficult at all Not difficult at all       05/27/2022    5:42 PM 06/03/2022    2:44 PM 11/23/2022    2:20 PM 07/18/2023    1:38 PM 07/22/2023    12:44 PM  Fall Risk  Falls in the past year?  0 0 0 0  Was there an injury with Fall?  0 0 0 0  Fall Risk Category Calculator  0 0 0  0  Fall Risk Category (Retired)  Low     (RETIRED) Patient Fall Risk Level Low fall risk Low fall risk     Patient at Risk for Falls Due to  No Fall Risks     Fall risk Follow up  Falls evaluation completed Falls evaluation completed       Patient-reported     SUMMARY AND PLAN:  Encounter for Medicare annual wellness exam   Discussed applicable health maintenance/preventive health measures and advised and referred or ordered per patient preferences: -reports had the tetanus vaccine 2 year ago, but is not sure where she did it -declines the shingles vaccine -reports is considering the covid and vaccines and will get at the pharmacy -she doe snot feel needs the hep c screening, advised can request with labs if decides to do  Health Maintenance  Topic Date Due   Hepatitis C Screening  Never done   COVID-19 Vaccine (4 - 2024-25 season) 08/07/2023 (Originally 03/28/2023)   Zoster Vaccines- Shingrix (1 of 2) 10/20/2023 (Originally 08/04/2002)   INFLUENZA VACCINE  10/25/2023 (Originally 02/25/2023)   DTaP/Tdap/Td (3 - Td or Tdap) 07/21/2024 (Originally 11/11/2022)   Medicare Annual Wellness (AWV)  07/21/2024   MAMMOGRAM  02/23/2025   Colonoscopy  03/08/2030   Pneumonia Vaccine 51+ Years old  Completed   DEXA SCAN  Completed   HPV VACCINES  Aged [employer] and counseling on the following was provided based on the above review of health and a plan/checklist for the patient, along with additional information discussed, was provided for the patient in the patient instructions :  -Advised on importance of completing advanced directives, discussed options for completing and provided information in patient instructions as well -Advised and counseled on a healthy lifestyle - including the importance of a healthy diet, regular physical  activity -Reviewed patient's current diet. Advised and counseled on a whole foods based healthy diet. A summary of a healthy diet was provided in the Patient Instructions.  -reviewed patient's current physical activity level and discussed exercise guidelines for adults. Discussed community resources and ideas for safe exercise at home to assist in meeting exercise guideline recommendations in a safe and healthy way.  -Advise yearly dental visits at minimum and regular eye exams -discussed blood pressure goals and lifestyle recs for BP -Advised and counseled on alcohol safe limits  Follow up: see patient instructions     Patient Instructions  I really enjoyed getting to talk with you today! I am available on Tuesdays and Thursdays for virtual visits if you have any questions or concerns, or if I can be of any further assistance.   CHECKLIST FROM ANNUAL WELLNESS VISIT:  -Follow up (please call to schedule if not scheduled after visit):   -yearly for annual wellness visit with primary care office  Here is a list of your preventive care/health maintenance measures and the plan for each if any are due:  PLAN For any measures below that may be due:  -if you wish to get the covid shot - you can do so at the pharmacy, please let us know when you do so that we can update your record -please obtain a copy of your tetanus vaccine record to bring to the office so that we can update your record  Health Maintenance  Topic Date Due   Hepatitis C Screening  Never done   COVID-19 Vaccine (4 - 2024-25 season) 08/07/2023 (  Originally 03/28/2023)   Zoster Vaccines- Shingrix (1 of 2) 10/20/2023 (Originally 08/04/2002)   INFLUENZA VACCINE  10/25/2023 (Originally 02/25/2023)   DTaP/Tdap/Td (3 - Td or Tdap) 07/21/2024 (Originally 11/11/2022)   Medicare Annual Wellness (AWV)  07/21/2024   MAMMOGRAM  02/23/2025   Colonoscopy  03/08/2030   Pneumonia Vaccine 94+ Years old  Completed   DEXA SCAN  Completed   HPV  VACCINES  Aged Out    -See a dentist at least yearly  -Get your eyes checked and then per your eye specialist's recommendations  -Other issues addressed today:   -I have included below further information regarding a healthy whole foods based diet, physical activity guidelines for adults, stress management and opportunities for social connections. I hope you find this information useful.   -----------------------------------------------------------------------------------------------------------------------------------------------------------------------------------------------------------------------------------------------------------  NUTRITION: -eat real food: lots of colorful vegetables (half the plate) and fruits -5-7 servings of vegetables and fruits per day (fresh or steamed is best), exp. 2 servings of vegetables with lunch and dinner and 2 servings of fruit per day. Berries and greens such as kale and collards are great choices.  -consume on a regular basis: whole grains (make sure first ingredient on label contains the word "whole"), fresh fruits, fish, nuts, seeds, healthy oils (such as olive oil, avocado oil, grape seed oil) -may eat small amounts of dairy and lean meat on occasion, but avoid processed meats such as ham, bacon, lunch meat, etc. -drink water -try to avoid fast food and pre-packaged foods, processed meat -most experts advise limiting sodium to < 2300mg  per day, should limit further is any chronic conditions such as high blood pressure, heart disease, diabetes, etc. The American Heart Association advised that < 1500mg  is is ideal -try to avoid foods that contain any ingredients with names you do not recognize  -try to avoid sugar/sweets (except for the natural sugar that occurs in fresh fruit) -try to avoid sweet drinks -try to avoid white rice, white bread, pasta (unless whole grain), white or yellow potatoes  EXERCISE GUIDELINES FOR ADULTS: -if you wish to  increase your physical activity, do so gradually and with the approval of your doctor -STOP and seek medical care immediately if you have any chest pain, chest discomfort or trouble breathing when starting or increasing exercise  -move and stretch your body, legs, feet and arms when sitting for long periods -Physical activity guidelines for optimal health in adults: -least 150 minutes per week of aerobic exercise (can talk, but not sing) once approved by your doctor, 20-30 minutes of sustained activity or two 10 minute episodes of sustained activity every day.  -resistance training at least 2 days per week if approved by your doctor -balance exercises 3+ days per week:   Stand somewhere where you have something sturdy to hold onto if you lose balance.    1) lift up on toes, start with 5x per day and work up to 20x   2) stand and lift on leg straight out to the side so that foot is a few inches of the floor, start with 5x each side and work up to 20x each side   3) stand on one foot, start with 5 seconds each side and work up to 20 seconds on each side  If you need ideas or help with getting more active:  -Silver sneakers https://tools.silversneakers.com  -Walk with a Doc: http://www.duncan-williams.com/  -try to include resistance (weight lifting/strength building) and balance exercises twice per week: or the following link for ideas: http://castillo-powell.com/  BuyDucts.dk  STRESS MANAGEMENT: -can try meditating, or just sitting quietly with deep breathing while intentionally relaxing all parts of your body for 5 minutes daily -if you need further help with stress, anxiety or depression please follow up with your primary doctor or contact the wonderful folks at [facility]: [phone]  SOCIAL CONNECTIONS: -options in [place] if you wish to engage in more social and exercise related  activities:  -Silver sneakers https://tools.silversneakers.com  -Walk with a Doc: http://www.duncan-williams.com/  -Check out the [facility] Active Adults 50+ section on the [place] of [employer] (hiking clubs, book clubs, cards and games, chess, exercise classes, aquatic classes and much more) - see the website for details: https://www.-[place].gov/departments/parks-recreation/active-adults50  -YouTube has lots of exercise videos for different ages and abilities as well  -[facility] (a variety of indoor and outdoor inperson activities for adults). [phone]. [address]  -Virtual Online Classes (a variety of topics): see seniorplanet.org or call [phone]  -consider volunteering at a school, hospice center, church, senior center or elsewhere  ADVANCED HEALTHCARE DIRECTIVES:  [place] Advanced Directives assistance:   ExpressWeek.com.cy  Everyone should have advanced health care directives in place. This is so that you get the care you want, should you ever be in a situation where you are unable to make your own medical decisions.   From the  Advanced Directive Website: "Advance Health Care Directives are legal documents in which you give written instructions about your health care if, in the future, you cannot speak for yourself.   A health care power of attorney allows you to name a person you trust to make your health care decisions if you cannot make them yourself. A declaration of a desire for a natural death (or living will) is document, which states that you desire not to have your life prolonged by extraordinary measures if you have a terminal or incurable illness or if you are in a vegetative state. An advance instruction for mental health treatment makes a declaration of instructions, information and preferences regarding your mental health treatment. It also states that you are aware that  the advance instruction authorizes a mental health treatment provider to act according to your wishes. It may also outline your consent or refusal of mental health treatment. A declaration of an anatomical gift allows anyone over the age of 61 to make a gift by will, organ donor card or other document."   Please see the following website or an elder law attorney for forms, FAQs and for completion of advanced directives: [place] TEFL teacher Health Care Directives Advance Health Care Directives (http://guzman.com/)  Or copy and paste the following to your web browser: PoshChat.fi           Terressa Koyanagi, DO

## 2023-07-22 NOTE — Progress Notes (Deleted)
"  Patient was unable to self-report due to a lack of equipment at home via telehealth"

## 2023-07-22 NOTE — Patient Instructions (Addendum)
I really enjoyed getting to talk with you today! I am available on Tuesdays and Thursdays for virtual visits if you have any questions or concerns, or if I can be of any further assistance.   CHECKLIST FROM ANNUAL WELLNESS VISIT:  -Follow up (please call to schedule if not scheduled after visit):   -yearly for annual wellness visit with primary care office  Here is a list of your preventive care/health maintenance measures and the plan for each if any are due:  PLAN For any measures below that may be due:  -if you wish to get the covid shot - you can do so at the pharmacy, please let us know when you do so that we can update your record -please obtain a copy of your tetanus vaccine record to bring to the office so that we can update your record  Health Maintenance  Topic Date Due   Hepatitis C Screening  Never done   COVID-19 Vaccine (4 - 2024-25 season) 08/07/2023 (Originally 03/28/2023)   Zoster Vaccines- Shingrix (1 of 2) 10/20/2023 (Originally 08/04/2002)   INFLUENZA VACCINE  10/25/2023 (Originally 02/25/2023)   DTaP/Tdap/Td (3 - Td or Tdap) 07/21/2024 (Originally 11/11/2022)   Medicare Annual Wellness (AWV)  07/21/2024   MAMMOGRAM  02/23/2025   Colonoscopy  03/08/2030   Pneumonia Vaccine 81+ Years old  Completed   DEXA SCAN  Completed   HPV VACCINES  Aged Out    -See a dentist at least yearly  -Get your eyes checked and then per your eye specialist's recommendations  -Other issues addressed today:   -I have included below further information regarding a healthy whole foods based diet, physical activity guidelines for adults, stress management and opportunities for social connections. I hope you find this information useful.    -----------------------------------------------------------------------------------------------------------------------------------------------------------------------------------------------------------------------------------------------------------  NUTRITION: -eat real food: lots of colorful vegetables (half the plate) and fruits -5-7 servings of vegetables and fruits per day (fresh or steamed is best), exp. 2 servings of vegetables with lunch and dinner and 2 servings of fruit per day. Berries and greens such as kale and collards are great choices.  -consume on a regular basis: whole grains (make sure first ingredient on label contains the word "whole"), fresh fruits, fish, nuts, seeds, healthy oils (such as olive oil, avocado oil, grape seed oil) -may eat small amounts of dairy and lean meat on occasion, but avoid processed meats such as ham, bacon, lunch meat, etc. -drink water -try to avoid fast food and pre-packaged foods, processed meat -most experts advise limiting sodium to < 2300mg  per day, should limit further is any chronic conditions such as high blood pressure, heart disease, diabetes, etc. The American Heart Association advised that < 1500mg  is is ideal -try to avoid foods that contain any ingredients with names you do not recognize  -try to avoid sugar/sweets (except for the natural sugar that occurs in fresh fruit) -try to avoid sweet drinks -try to avoid white rice, white bread, pasta (unless whole grain), white or yellow potatoes  EXERCISE GUIDELINES FOR ADULTS: -if you wish to increase your physical activity, do so gradually and with the approval of your doctor -STOP and seek medical care immediately if you have any chest pain, chest discomfort or trouble breathing when starting or increasing exercise  -move and stretch your body, legs, feet and arms when sitting for long periods -Physical activity guidelines for optimal health in adults: -least 150 minutes per week of  aerobic exercise (can talk, but not sing) once approved by  your doctor, 20-30 minutes of sustained activity or two 10 minute episodes of sustained activity every day.  -resistance training at least 2 days per week if approved by your doctor -balance exercises 3+ days per week:   Stand somewhere where you have something sturdy to hold onto if you lose balance.    1) lift up on toes, start with 5x per day and work up to 20x   2) stand and lift on leg straight out to the side so that foot is a few inches of the floor, start with 5x each side and work up to 20x each side   3) stand on one foot, start with 5 seconds each side and work up to 20 seconds on each side  If you need ideas or help with getting more active:  -Silver sneakers https://tools.silversneakers.com  -Walk with a Doc: http://www.duncan-williams.com/  -try to include resistance (weight lifting/strength building) and balance exercises twice per week: or the following link for ideas: http://castillo-powell.com/  BuyDucts.dk  STRESS MANAGEMENT: -can try meditating, or just sitting quietly with deep breathing while intentionally relaxing all parts of your body for 5 minutes daily -if you need further help with stress, anxiety or depression please follow up with your primary doctor or contact the wonderful folks at [facility]: [phone]  SOCIAL CONNECTIONS: -options in [place] if you wish to engage in more social and exercise related activities:  -Silver sneakers https://tools.silversneakers.com  -Walk with a Doc: http://www.duncan-williams.com/  -Check out the [facility] Active Adults 50+ section on the [place] [employer] (hiking clubs, book clubs, cards and games, chess, exercise classes, aquatic classes and much more) - see the website for  details: https://www.[place].gov/departments/parks-recreation/active-adults50  -YouTube has lots of exercise videos for different ages and abilities as well  -[facility] (a variety of indoor and outdoor inperson activities for adults). [phone]. [address]  -Virtual Online Classes (a variety of topics): see seniorplanet.org or call [phone]  -consider volunteering at a school, hospice center, church, senior center or elsewhere  ADVANCED HEALTHCARE DIRECTIVES:  [place] Advanced Directives assistance:   ExpressWeek.com.cy  Everyone should have advanced health care directives in place. This is so that you get the care you want, should you ever be in a situation where you are unable to make your own medical decisions.   From the [place] Advanced Directive Website: "Advance Health Care Directives are legal documents in which you give written instructions about your health care if, in the future, you cannot speak for yourself.   A health care power of attorney allows you to name a person you trust to make your health care decisions if you cannot make them yourself. A declaration of a desire for a natural death (or living will) is document, which states that you desire not to have your life prolonged by extraordinary measures if you have a terminal or incurable illness or if you are in a vegetative state. An advance instruction for mental health treatment makes a declaration of instructions, information and preferences regarding your mental health treatment. It also states that you are aware that the advance instruction authorizes a mental health treatment provider to act according to your wishes. It may also outline your consent or refusal of mental health treatment. A declaration of an anatomical gift allows anyone over the age of 4 to make a gift by will, organ donor card or other document."   Please see  the following website or an elder law attorney for forms, FAQs  and for completion of advanced directives: [place] TEFL teacher Health Care Directives Advance Health Care Directives (http://guzman.com/)  Or copy and paste the following to your web browser: PoshChat.fi

## 2023-10-27 DIAGNOSIS — Z0100 Encounter for examination of eyes and vision without abnormal findings: Secondary | ICD-10-CM | POA: Diagnosis not present

## 2023-10-27 DIAGNOSIS — H524 Presbyopia: Secondary | ICD-10-CM | POA: Diagnosis not present

## 2023-11-10 ENCOUNTER — Other Ambulatory Visit: Payer: Self-pay | Admitting: Internal Medicine

## 2023-11-10 DIAGNOSIS — Z1231 Encounter for screening mammogram for malignant neoplasm of breast: Secondary | ICD-10-CM

## 2023-12-12 ENCOUNTER — Other Ambulatory Visit: Payer: Self-pay | Admitting: Internal Medicine

## 2023-12-12 DIAGNOSIS — J302 Other seasonal allergic rhinitis: Secondary | ICD-10-CM

## 2024-02-09 ENCOUNTER — Other Ambulatory Visit: Payer: Self-pay | Admitting: Internal Medicine

## 2024-02-09 DIAGNOSIS — I10 Essential (primary) hypertension: Secondary | ICD-10-CM

## 2024-03-01 ENCOUNTER — Ambulatory Visit
Admission: RE | Admit: 2024-03-01 | Discharge: 2024-03-01 | Disposition: A | Discharge status: Home / Self Care | Source: Ambulatory Visit | Attending: Internal Medicine | Admitting: Internal Medicine

## 2024-03-01 DIAGNOSIS — Z1231 Encounter for screening mammogram for malignant neoplasm of breast: Secondary | ICD-10-CM | POA: Diagnosis not present

## 2024-03-28 ENCOUNTER — Encounter: Payer: Self-pay | Admitting: Internal Medicine

## 2024-05-06 ENCOUNTER — Other Ambulatory Visit: Payer: Self-pay | Admitting: Internal Medicine

## 2024-05-06 DIAGNOSIS — I10 Essential (primary) hypertension: Secondary | ICD-10-CM

## 2024-05-30 ENCOUNTER — Ambulatory Visit: Payer: Self-pay

## 2024-05-30 NOTE — Telephone Encounter (Signed)
 Triage attempted to reach patient. Left VM to call back to discuss. Attempt #1  Message from [facility] sent at 05/30/2024  3:52 PM EST  Summary: Soreness/Tenderness on Left Breast   Reason for Triage: Soreness on left side of breasts and kinda tender on left side where she had masectomy, down on the side and up under the armpit. 5 pain/tenderness; When patient takes a shower, that side looks darker than the right side- discoloration of skin.  Patient has a physical scheduled for February but was initially calling to get a sooner appt with Dr. Theophilus.  [phone] (M)

## 2024-05-30 NOTE — Telephone Encounter (Signed)
 FYI Only or Action Required?: FYI only for provider: appointment scheduled on 06/01/24.  Patient was last seen in primary care on 07/22/2023 by Luke Chiquita SAUNDERS, DO.  Called Nurse Triage reporting Breast Problem.  Symptoms began several weeks ago.  Interventions attempted: Nothing.  Symptoms are: gradually worsening.  Triage Disposition: See PCP Within 2 Weeks  Patient/caregiver understands and will follow disposition?: Yes         Reason for Disposition  [1] Breast pain AND [2] cause is not known    Hx of L mastectomy and pt c.o L sided skin discoloration/soreness.  Answer Assessment - Initial Assessment Questions 1. SYMPTOM: What's the main symptom you're concerned about?  (e.g., lump, nipple discharge, pain, rash)     Soreness, discoloration, tenderness on L breast side 2. LOCATION: Where is the sx located?     L side 3. ONSET: When did sx  start?     A few weeks 4. PRIOR HISTORY: Do you have any history of prior problems with your breasts? (e.g., breast cancer, breast implant, fibrocystic breast disease)     Hx of L mastectomy  5. CAUSE: What do you think is causing this symptom?     unknown 6. OTHER SYMPTOMS: Do you have any other symptoms? (e.g., breast pain, fever, nipple discharge, redness or rash)     Occasional neck stiffness/neck 7. PREGNANCY-BREASTFEEDING: Is there any chance you are pregnant? When was your last menstrual period? Are you breastfeeding?     N/a  Protocols used: Breast Symptoms-A-AH

## 2024-05-30 NOTE — Telephone Encounter (Signed)
 noted

## 2024-05-31 ENCOUNTER — Encounter: Admitting: Family Medicine

## 2024-06-01 ENCOUNTER — Ambulatory Visit (INDEPENDENT_AMBULATORY_CARE_PROVIDER_SITE_OTHER): Admitting: *Deleted

## 2024-06-01 ENCOUNTER — Encounter: Admitting: Internal Medicine

## 2024-06-01 DIAGNOSIS — Z23 Encounter for immunization: Secondary | ICD-10-CM | POA: Diagnosis not present

## 2024-06-01 NOTE — Progress Notes (Signed)
 This encounter was created in error - please disregard.

## 2024-08-06 ENCOUNTER — Other Ambulatory Visit: Payer: Self-pay | Admitting: Internal Medicine

## 2024-08-06 DIAGNOSIS — I10 Essential (primary) hypertension: Secondary | ICD-10-CM

## 2024-09-06 ENCOUNTER — Encounter: Admitting: Internal Medicine
